# Patient Record
Sex: Male | Born: 1955 | Race: White | Hispanic: No | Marital: Married | Smoking: Never smoker
Health system: Southern US, Community
[De-identification: ages and names within clinical notes are randomized; demographics above are authoritative.]

## PROBLEM LIST (undated history)

## (undated) DIAGNOSIS — E782 Mixed hyperlipidemia: Secondary | ICD-10-CM

## (undated) DIAGNOSIS — F1020 Alcohol dependence, uncomplicated: Secondary | ICD-10-CM

## (undated) DIAGNOSIS — K219 Gastro-esophageal reflux disease without esophagitis: Secondary | ICD-10-CM

## (undated) DIAGNOSIS — E119 Type 2 diabetes mellitus without complications: Secondary | ICD-10-CM

## (undated) DIAGNOSIS — R634 Abnormal weight loss: Secondary | ICD-10-CM

## (undated) DIAGNOSIS — K7460 Unspecified cirrhosis of liver: Secondary | ICD-10-CM

## (undated) DIAGNOSIS — K2270 Barrett's esophagus without dysplasia: Secondary | ICD-10-CM

## (undated) DIAGNOSIS — I700 Atherosclerosis of aorta: Secondary | ICD-10-CM

## (undated) DIAGNOSIS — I350 Nonrheumatic aortic (valve) stenosis: Secondary | ICD-10-CM

## (undated) DIAGNOSIS — I10 Essential (primary) hypertension: Secondary | ICD-10-CM

## (undated) HISTORY — PX: TONSILLECTOMY: SUR1361

## (undated) HISTORY — PX: IMPLANTATION, AORTIC VALVE, TRANSCATHETER, SUBCLAVIAN ARTERY APPROACH: SHX7173

## (undated) HISTORY — PX: COLONOSCOPY: SHX174

---

## 2011-04-03 ENCOUNTER — Ambulatory Visit: Payer: Self-pay | Admitting: Family Medicine

## 2011-04-10 ENCOUNTER — Ambulatory Visit: Payer: Self-pay | Admitting: Urology

## 2011-04-17 ENCOUNTER — Ambulatory Visit: Payer: Self-pay | Admitting: Urology

## 2011-04-23 ENCOUNTER — Ambulatory Visit: Payer: Self-pay | Admitting: Urology

## 2011-04-26 ENCOUNTER — Ambulatory Visit: Payer: Self-pay | Admitting: Urology

## 2011-05-03 ENCOUNTER — Ambulatory Visit: Payer: Self-pay | Admitting: Urology

## 2011-05-24 ENCOUNTER — Ambulatory Visit: Payer: Self-pay | Admitting: Urology

## 2011-06-05 ENCOUNTER — Ambulatory Visit: Payer: Self-pay | Admitting: Urology

## 2011-06-19 ENCOUNTER — Ambulatory Visit: Payer: Self-pay | Admitting: Urology

## 2011-06-28 ENCOUNTER — Ambulatory Visit: Payer: Self-pay | Admitting: Urology

## 2011-08-23 ENCOUNTER — Ambulatory Visit: Payer: Self-pay | Admitting: Urology

## 2012-09-24 IMAGING — CR DG ABDOMEN 1V
1 series · 1 of 1 positions shown · non-contrast
Comparison: none

REASON FOR EXAM: kidney stone
COMMENTS:

[supine kub]
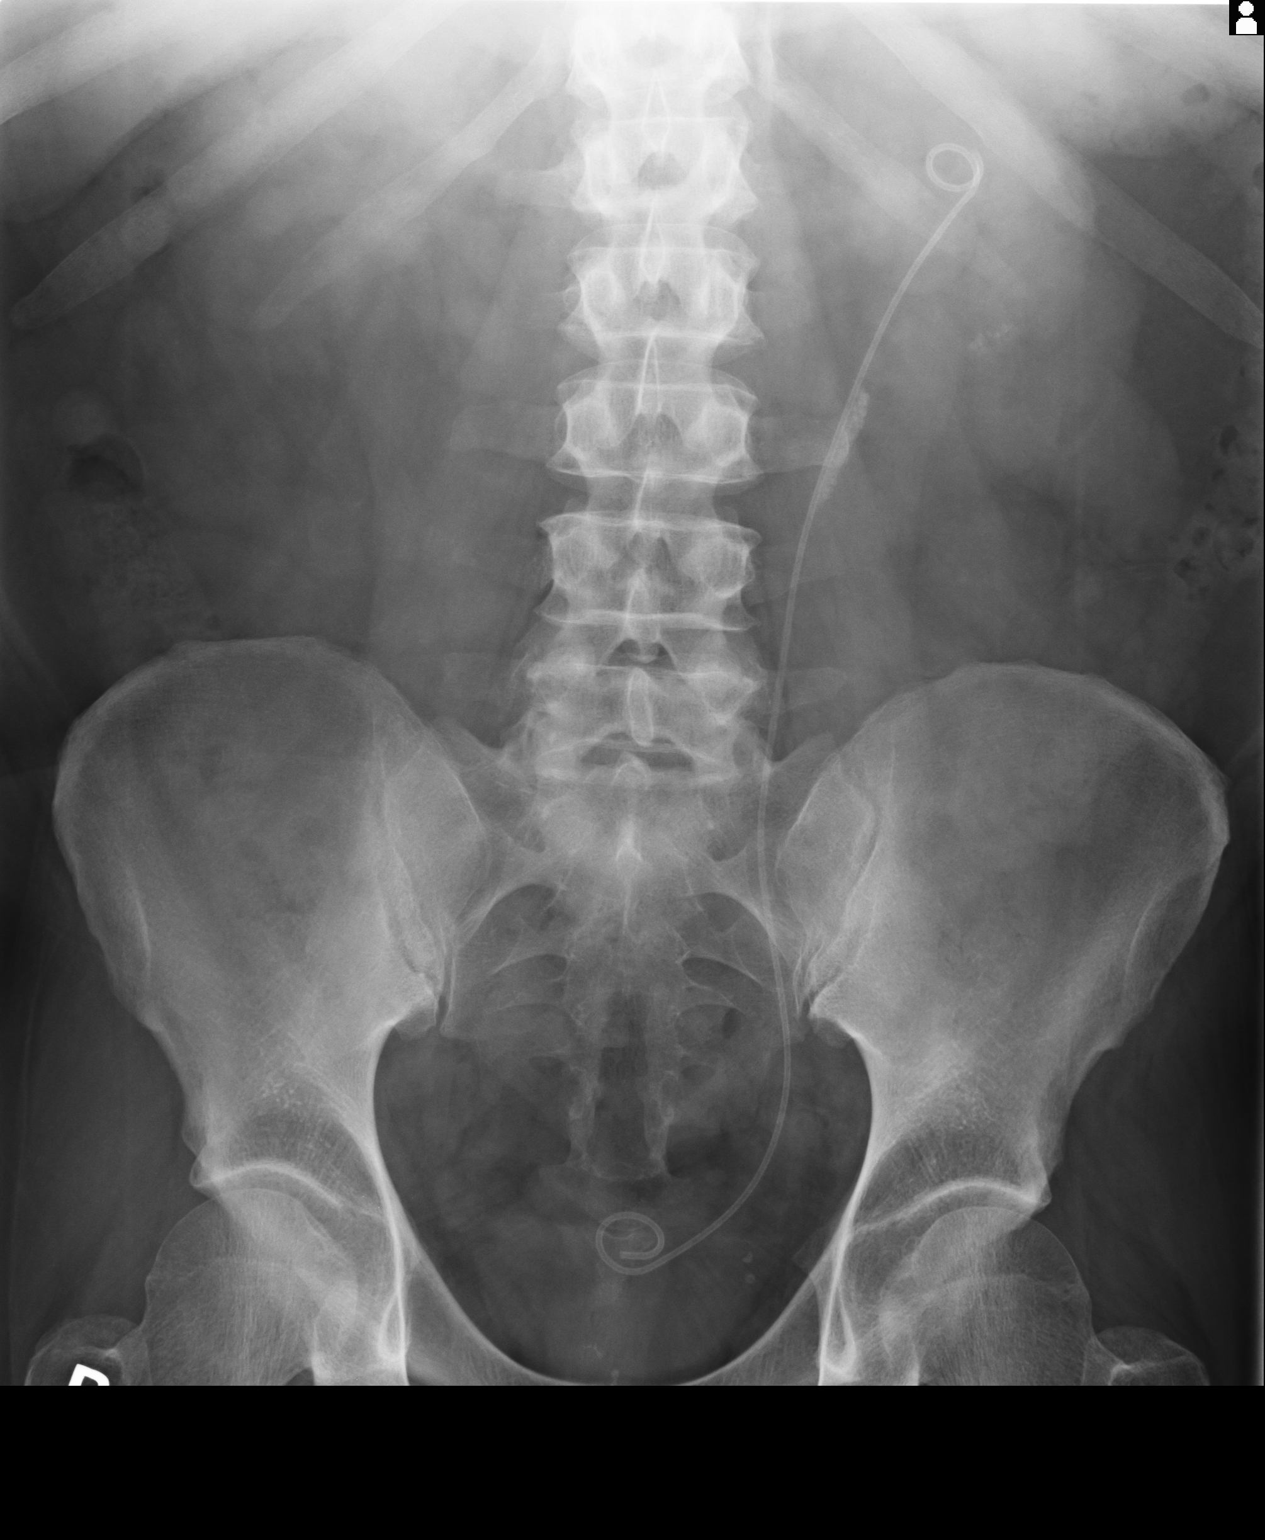

[1 of 1 positions shown; findings below may reference images not displayed]

PROCEDURE:     DXR - DXR KIDNEY URETER BLADDER  - May 03, 2011 [DATE]

RESULT:     Comparison is made to the prior exam of 04/26/2011. There are
noted a few calcifications projected over the lower pole of the left kidney
compatible with a complex of small stone fragments. There are multiple stone
fragments visualized adjacent to the left ureteral stent. The stone
fragments are at the level of the L3 lumbar transverse process. No definite
right renal or right ureteral stones are seen.
IMPRESSION: 1. A left ureteral stent is present.
2. There is a complex of small calcification fragments projected over the
lower pole of the left kidney.
3. There is a linear band of stone fragments in the proximal left ureter
adjacent to the stent at the level of the L3 lumbar spine transverse process.

## 2012-10-15 IMAGING — CR DG ABDOMEN 1V
1 series · 2 of 2 positions shown · non-contrast
Comparison: none

REASON FOR EXAM: KIDNEY STONE
COMMENTS:

[Series 4: t abdomen supine · 0.14mm/px · 2 of 2 slices shown]
[im 1/2]
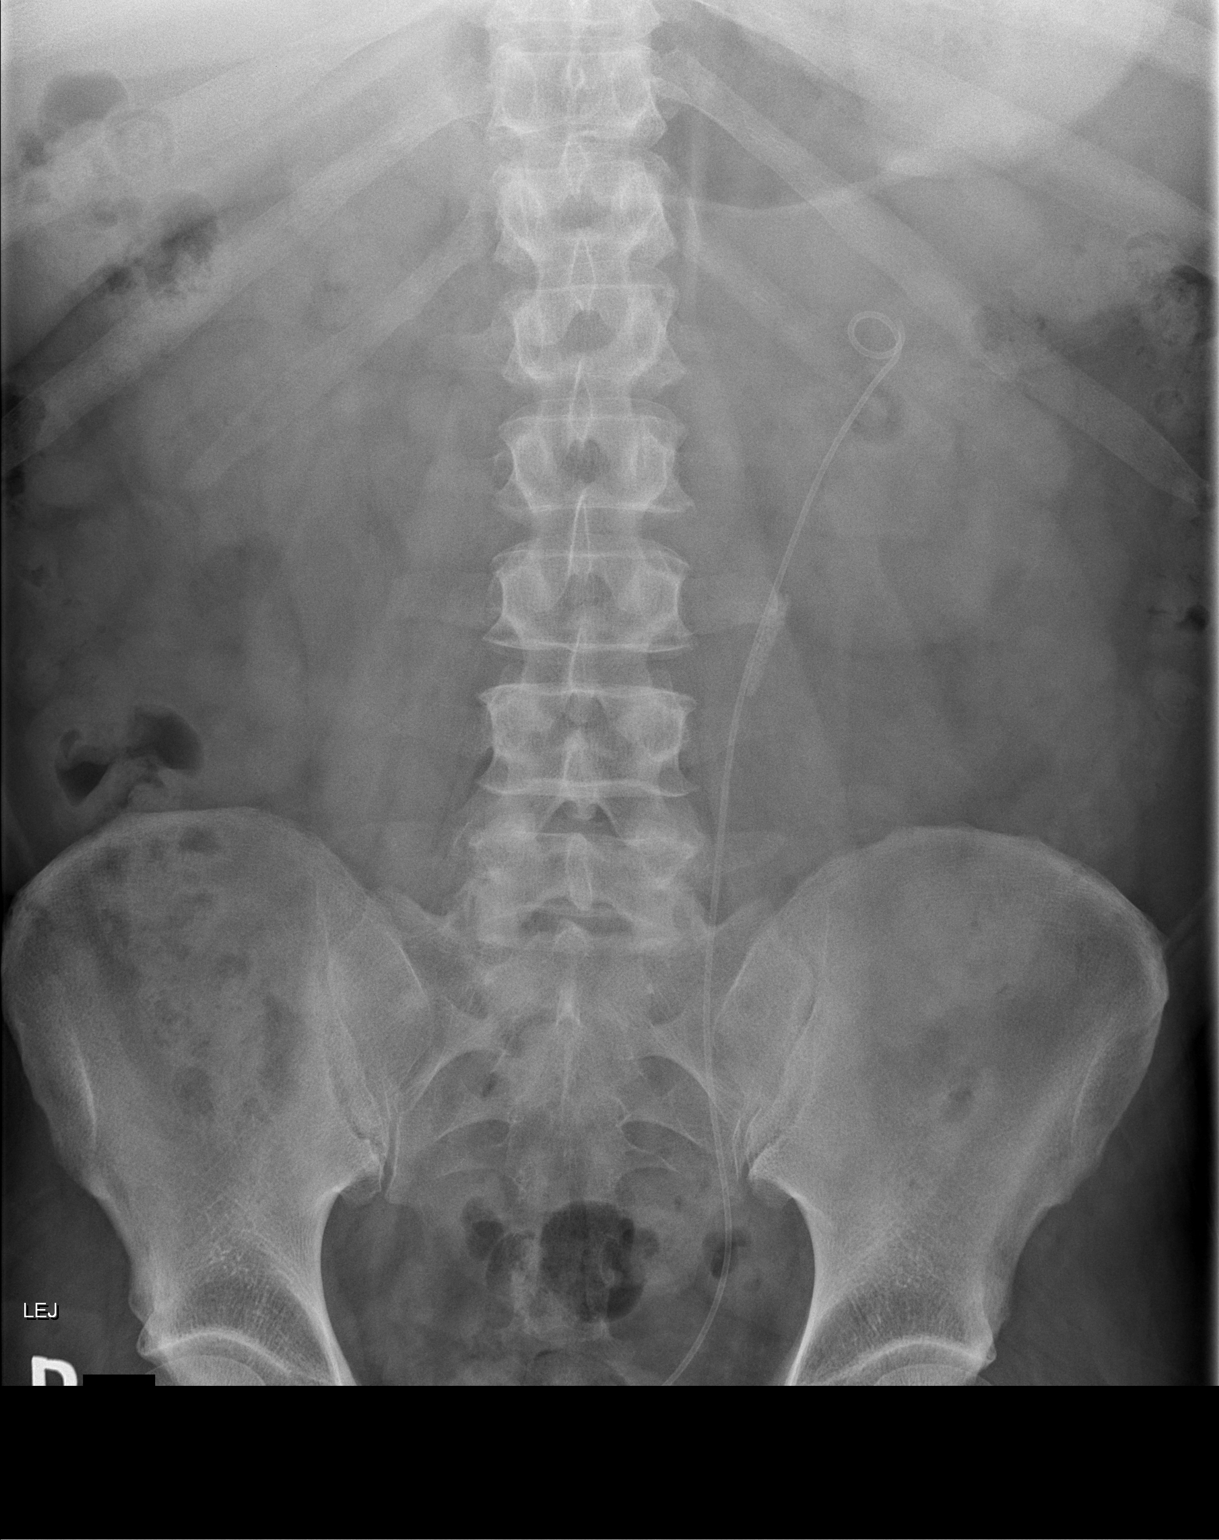
[im 2/2]
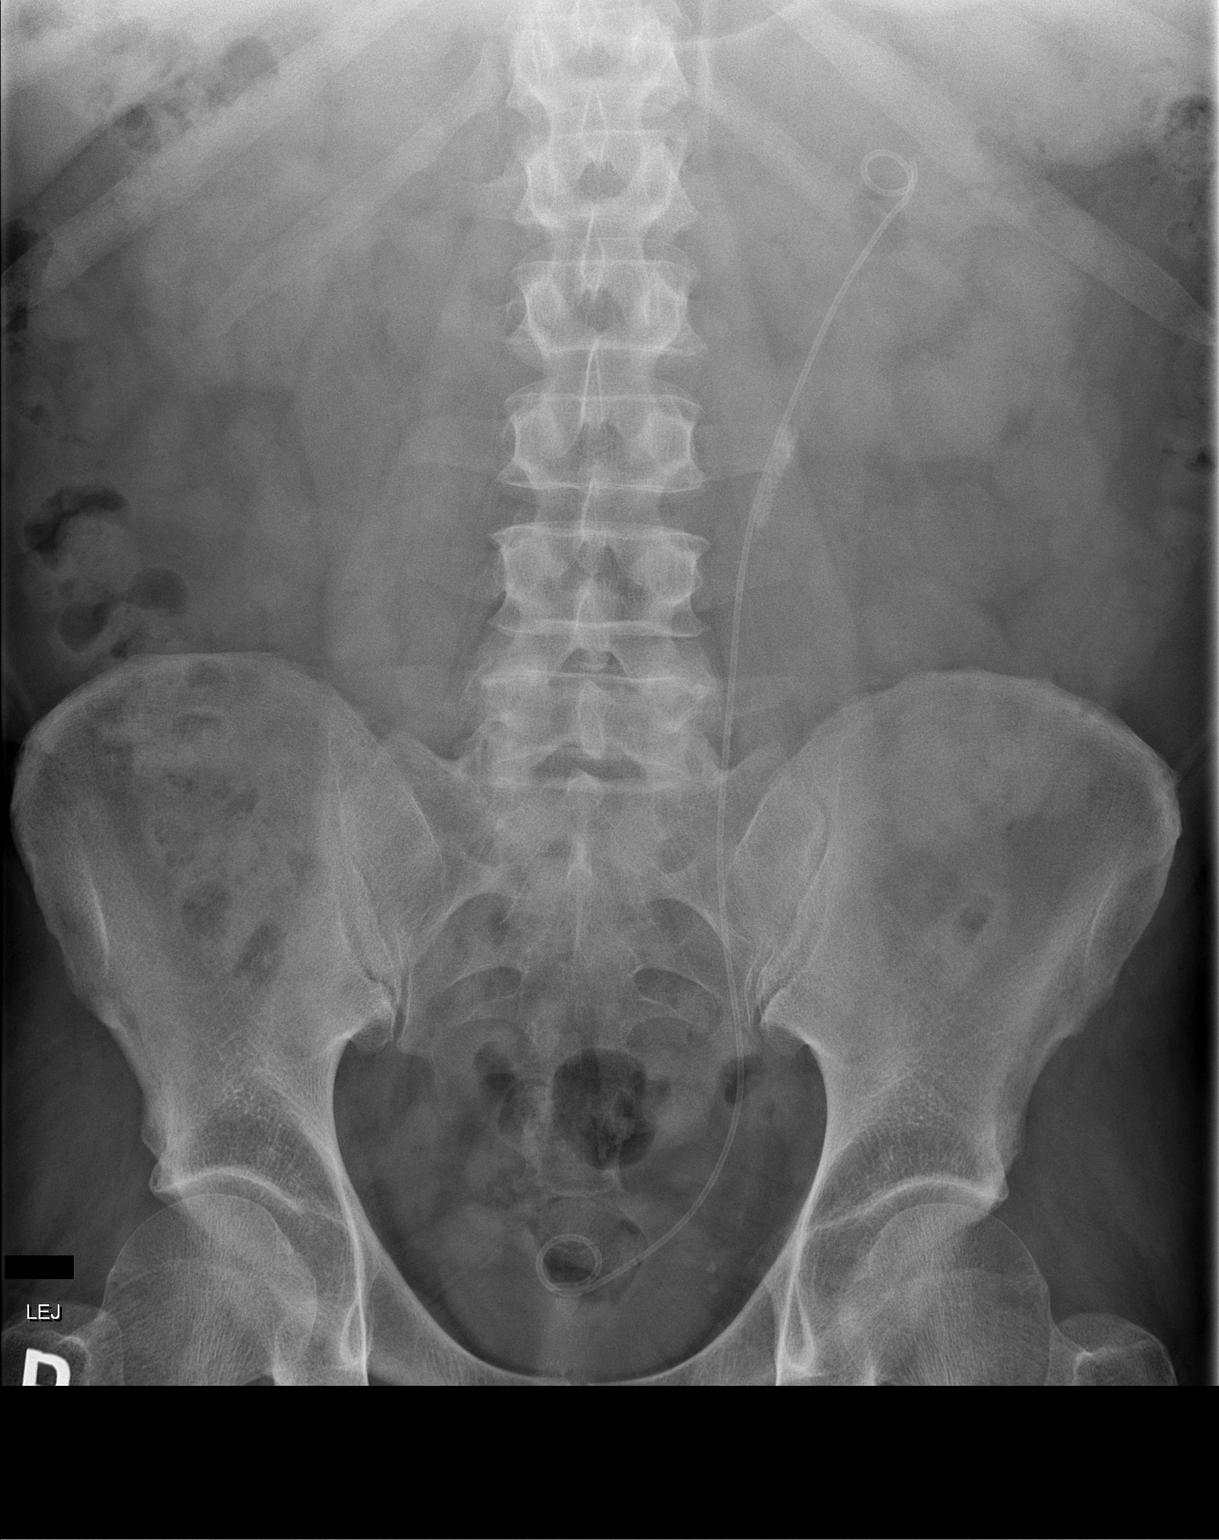

[2 of 2 positions shown; findings below may reference images not displayed]

PROCEDURE:     DXR - DXR KIDNEY URETER BLADDER  - May 24, 2011  [DATE]

RESULT:     Comparison is made to the study of to May 2011.

A double-J ureteral stent on the left is unchanged. There is calcific
density in a linear fashion suggesting [place] in the proximal one
third of the left ureter. Appears unchanged. I do not see definite stones
elsewhere over both kidneys or along the expected course of the ureters.
IMPRESSION: There is a stable appearing calcification in the proximal
one third of the stented left ureter. The previously demonstrated stones
over the lower pole of the left kidney are not visible today.

[REDACTED]

## 2012-11-19 IMAGING — CR DG ABDOMEN 1V
1 series · 2 of 2 positions shown · non-contrast
Comparison: none

REASON FOR EXAM: flank pain kidney stone follow up
COMMENTS:

PROCEDURE:     DXR - DXR KIDNEY URETER BLADDER  - June 28, 2011  [DATE]
RESULT:     Comparison: 06/19/2011

[Series 8: t abdomen supine · 0.14mm/px · 2 of 2 slices shown]
[im 1/2]
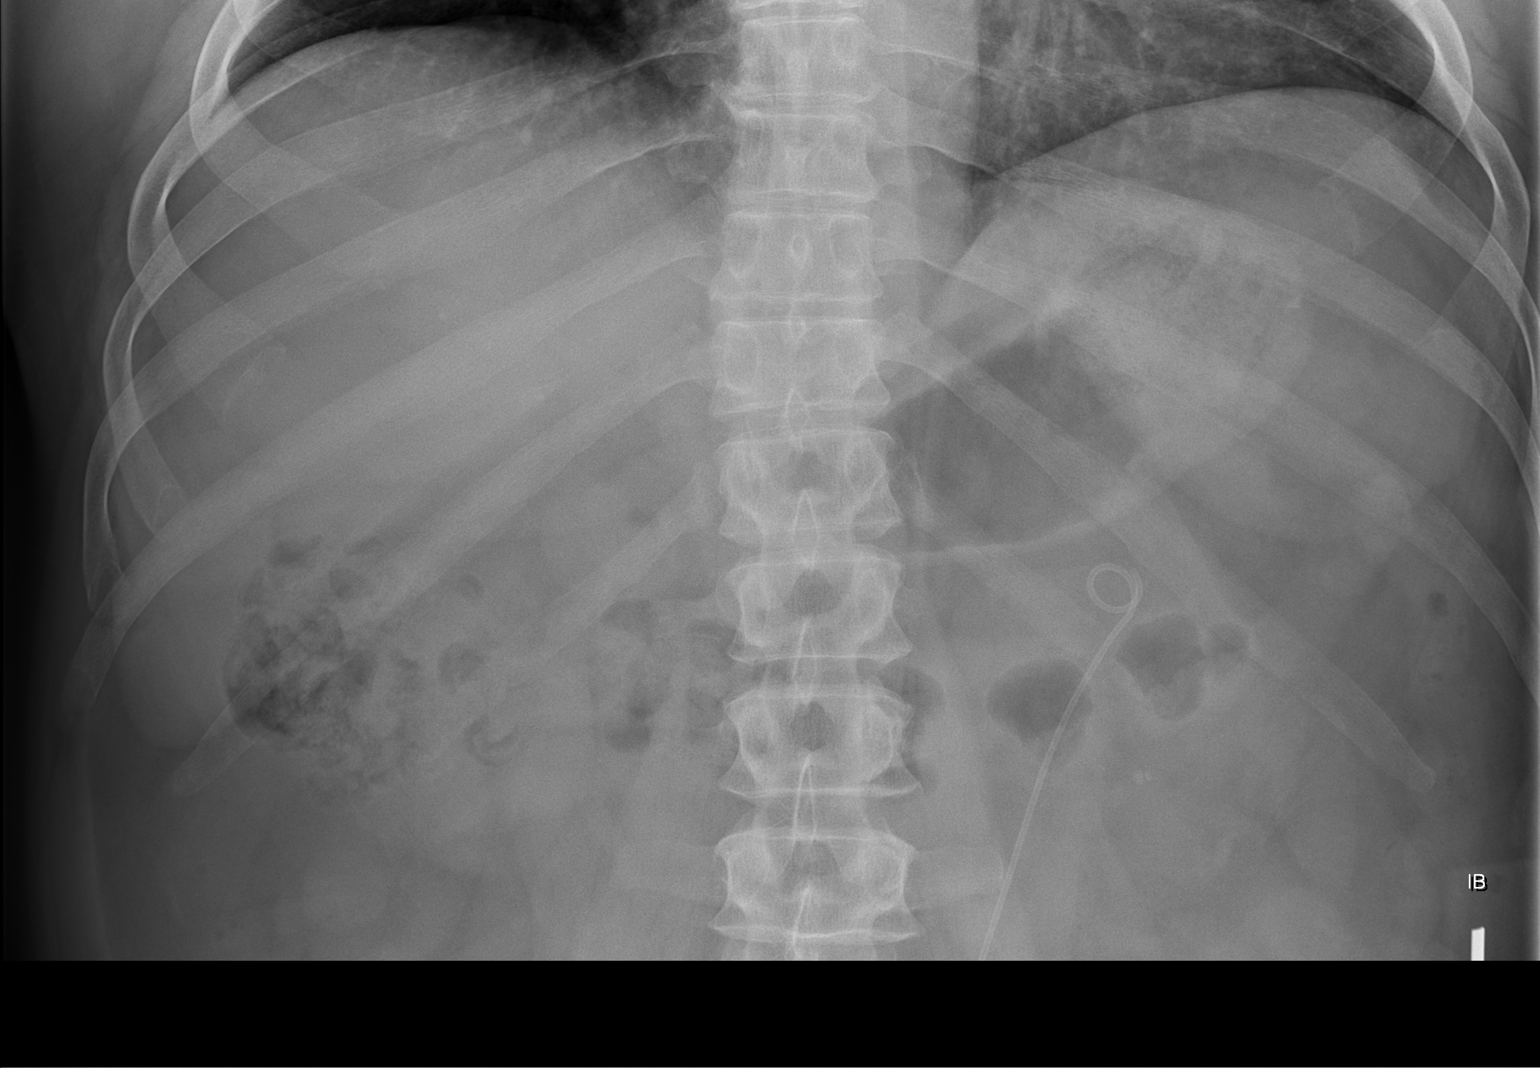
[im 2/2]
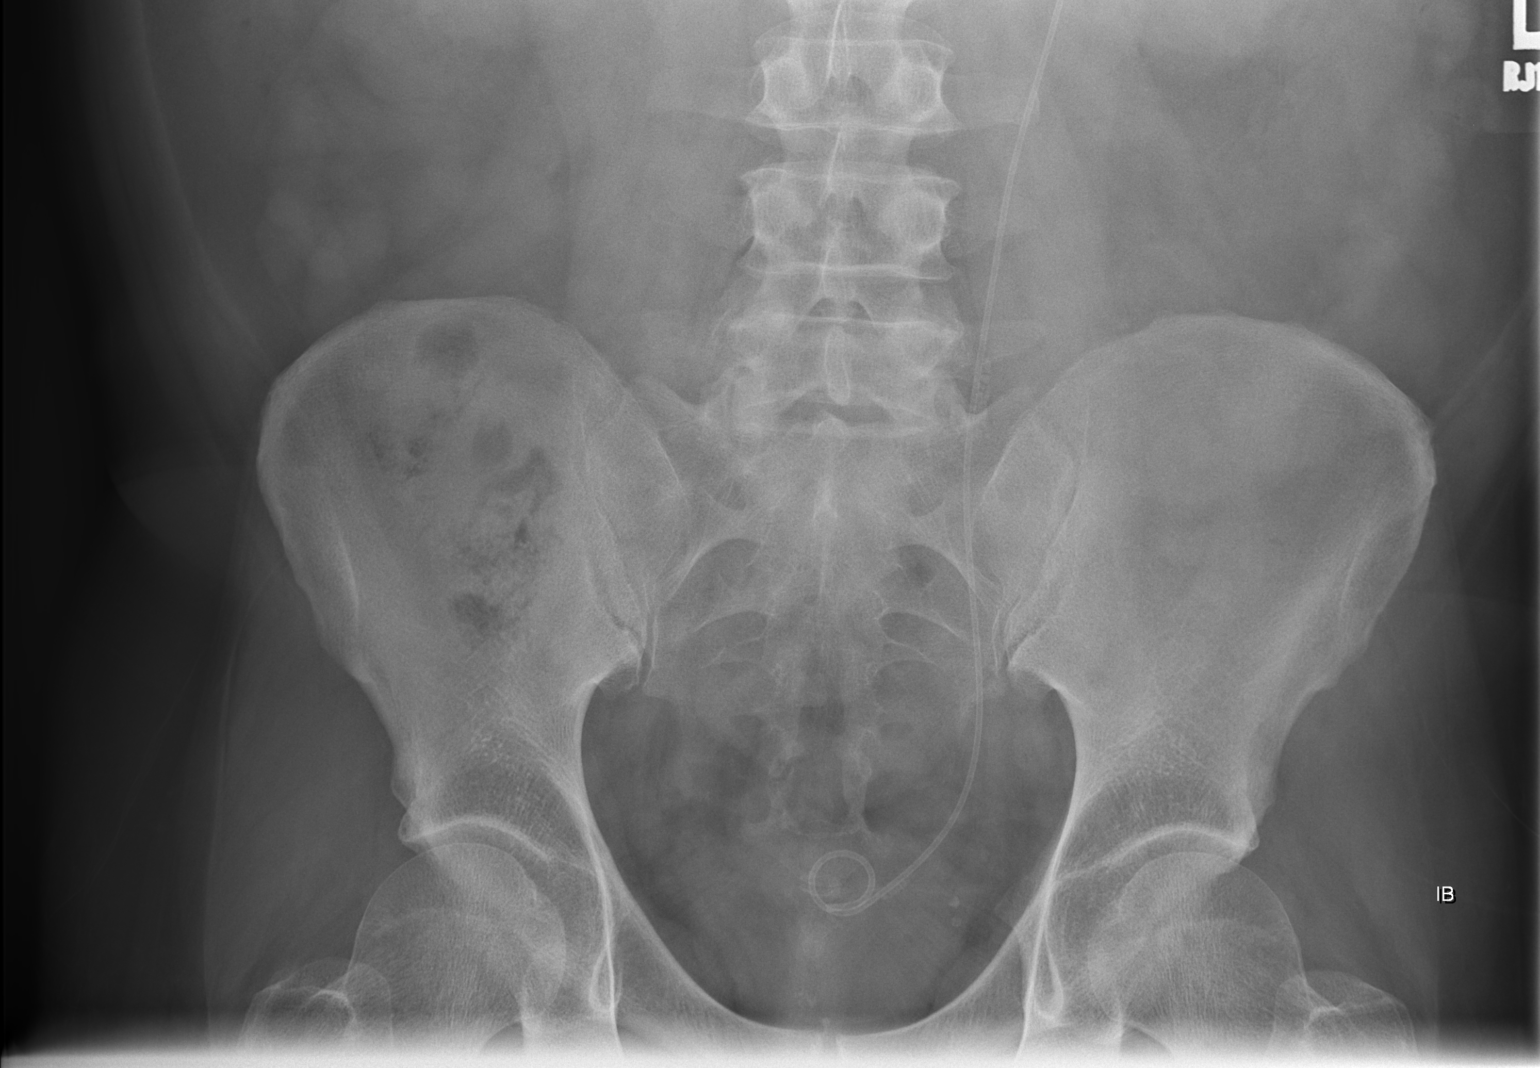

[2 of 2 positions shown; findings below may reference images not displayed]

FINDINGS: A left-sided double-J renal stent is in place. There are multiple small
calcific densities overlying the stent at the level of L5-S1, which is more
inferior than prior. Calcifications overlying left kidney are similar to
prior. Calcifications in pelvis are likely secondary to phleboliths.
IMPRESSION: Slight interval advancement of the calcifications in the left ureter, now
overlying the level of L5-S1.

## 2014-04-25 NOTE — Op Note (Signed)
PATIENT NAME:  Christopher Huff, Christopher Huff MR#:  [id number] DATE OF BIRTH:  10-Jan-1955  DATE OF PROCEDURE:  04/23/2011  PREOPERATIVE DIAGNOSIS: Left renal calculus.   POSTOPERATIVE DIAGNOSIS: Left renal calculus.  PROCEDURES: Cystoscopy, placement of double-J stent.  SURGEON: Alcus Dad, MD  ANESTHESIA: General.   INDICATIONS: This 59 year old man noted flank pain on the left. He was noted to have a stone in the left renal pelvis on x-ray evaluation. The stone is 1 cm in overall diameter. He is scheduled for placement of a stent prior to lithotripsy.   DESCRIPTION OF PROCEDURE: In the dorsal lithotomy position under general anesthesia the lower abdomen and genital area was prepped and draped for endoscopic work. The #21 cystoscope was introduced and inspection of the bladder performed revealing benign prostatic hypertrophy but no other abnormality. A hydrophil Glidewire was introduced into the left renal pelvis and confirmed by fluoro. A 28 cm, 4.8 French double-J stent was introduced. The retrieval string was left in place, but was shortened. Fluid was drained from the bladder, and the procedure was concluded. A final fluoroscopic view revealed that the stent was in good position, in the left renal pelvis. The patient tolerated the procedure well and returned to the recovery area in satisfactory condition. ____________________________ Alcus Dad, MD jsh:slb D: 04/23/2011 13:36:25 ET    T: 04/23/2011 13:46:27 ET        JOB#: [id number] cc: Alcus Dad, MD, <Dictator> Alcus Dad MD ELECTRONICALLY SIGNED 04/24/2011 12:59

## 2015-02-09 ENCOUNTER — Telehealth: Payer: Self-pay | Admitting: Family Medicine

## 2015-02-09 NOTE — Telephone Encounter (Signed)
Pt would like to re-establish with Dr. Caryn Section. Pt was last seen by Dr. Caryn Section on 03/29/11. Pt would like to come in to re-establish care and be seen for cough, congestion, & ear pain. Pt would like to be worked in tomorrow if possible. No medications. No new medical conditions. Insurance: [employer]. Pt stated he hasn't had another PCP he just hasn't been sick in the last 3 years. Can we re-establish? If so, when can we work him in? Please advise. Thanks TNP

## 2015-02-09 NOTE — Telephone Encounter (Signed)
He can have the 8:30am. If he can't make that [facility] has plenty of slots available.

## 2015-02-09 NOTE — Telephone Encounter (Signed)
Please review-aa 

## 2015-02-10 NOTE — Telephone Encounter (Signed)
Called pt to schedule appointment-Left message/MW

## 2020-08-16 ENCOUNTER — Other Ambulatory Visit: Payer: Self-pay

## 2020-08-16 ENCOUNTER — Encounter: Payer: Self-pay | Admitting: Emergency Medicine

## 2020-08-16 ENCOUNTER — Emergency Department: Payer: Medicare HMO

## 2020-08-16 ENCOUNTER — Emergency Department
Admission: EM | Admit: 2020-08-16 | Discharge: 2020-08-16 | Disposition: A | Discharge status: Home / Self Care | Payer: Medicare HMO | Attending: Emergency Medicine | Admitting: Emergency Medicine

## 2020-08-16 DIAGNOSIS — R1900 Intra-abdominal and pelvic swelling, mass and lump, unspecified site: Secondary | ICD-10-CM | POA: Diagnosis not present

## 2020-08-16 DIAGNOSIS — F17200 Nicotine dependence, unspecified, uncomplicated: Secondary | ICD-10-CM | POA: Insufficient documentation

## 2020-08-16 DIAGNOSIS — E119 Type 2 diabetes mellitus without complications: Secondary | ICD-10-CM | POA: Insufficient documentation

## 2020-08-16 DIAGNOSIS — R739 Hyperglycemia, unspecified: Secondary | ICD-10-CM | POA: Diagnosis present

## 2020-08-16 LAB — COMPREHENSIVE METABOLIC PANEL
ALT: 21 U/L (ref 0–44)
AST: 33 U/L (ref 15–41)
Albumin: 3.9 g/dL (ref 3.5–5.0)
Alkaline Phosphatase: 166 U/L — ABNORMAL HIGH (ref 38–126)
Anion gap: 13 (ref 5–15)
BUN: 10 mg/dL (ref 8–23)
CO2: 24 mmol/L (ref 22–32)
Calcium: 9.1 mg/dL (ref 8.9–10.3)
Chloride: 94 mmol/L — ABNORMAL LOW (ref 98–111)
Creatinine, Ser: 0.67 mg/dL (ref 0.61–1.24)
GFR, Estimated: >60 mL/min (ref >60)
Glucose, Bld: 495 mg/dL — ABNORMAL HIGH (ref 70–99)
Potassium: 4 mmol/L (ref 3.5–5.1)
Sodium: 131 mmol/L — ABNORMAL LOW (ref 135–145)
Total Bilirubin: 1.5 mg/dL — ABNORMAL HIGH (ref 0.3–1.2)
Total Protein: 7.4 g/dL (ref 6.5–8.1)

## 2020-08-16 LAB — CBC WITH DIFFERENTIAL/PLATELET
Abs Immature Granulocytes: 0.01 10*3/uL (ref 0.00–0.07)
Basophils Absolute: 0.1 10*3/uL (ref 0.0–0.1)
Basophils Relative: 1 %
Eosinophils Absolute: 0.1 10*3/uL (ref 0.0–0.5)
Eosinophils Relative: 2 %
HCT: 40.5 % (ref 39.0–52.0)
Hemoglobin: 14.4 g/dL (ref 13.0–17.0)
Immature Granulocytes: 0 %
Lymphocytes Relative: 19 %
Lymphs Abs: 1 10*3/uL (ref 0.7–4.0)
MCH: 36.1 pg — ABNORMAL HIGH (ref 26.0–34.0)
MCHC: 35.6 g/dL (ref 30.0–36.0)
MCV: 101.5 fL — ABNORMAL HIGH (ref 80.0–100.0)
Monocytes Absolute: 0.6 10*3/uL (ref 0.1–1.0)
Monocytes Relative: 11 %
Neutro Abs: 3.3 10*3/uL (ref 1.7–7.7)
Neutrophils Relative %: 67 %
Platelets: 147 10*3/uL — ABNORMAL LOW (ref 150–400)
RBC: 3.99 MIL/uL — ABNORMAL LOW (ref 4.22–5.81)
RDW: 12.6 % (ref 11.5–15.5)
WBC: 5.1 10*3/uL (ref 4.0–10.5)
nRBC: 0 % (ref 0.0–0.2)

## 2020-08-16 LAB — CBG MONITORING, ED: Glucose-Capillary: 490 mg/dL — ABNORMAL HIGH (ref 70–99)

## 2020-08-16 MED ORDER — INSULIN ASPART 100 UNIT/ML IJ SOLN
8.0000 [IU] | Freq: Once | INTRAMUSCULAR | Status: AC
  Administered 2020-08-16: 8 [IU] via INTRAVENOUS
  Filled 2020-08-16: qty 1

## 2020-08-16 MED ORDER — IOHEXOL 350 MG/ML SOLN
100.0000 mL | Freq: Once | INTRAVENOUS | Status: AC | PRN
  Administered 2020-08-16: 100 mL via INTRAVENOUS
  Filled 2020-08-16: qty 100

## 2020-08-16 MED ORDER — METFORMIN HCL 500 MG PO TABS: 500.0000 mg | ORAL_TABLET | Freq: Two times a day (BID) | ORAL | 11 refills | Status: AC

## 2020-08-16 MED ORDER — SODIUM CHLORIDE 0.9 % IV SOLN: Freq: Once | INTRAVENOUS | Status: AC

## 2020-08-16 NOTE — ED Provider Notes (Signed)
[facility] Emergency Department Provider Note   ____________________________________________    I have reviewed the triage vital signs and the nursing notes.   HISTORY  Chief Complaint Elevated glucose     HPI Christopher Huff is a 65 y.o. male who reports no significant past medical history presents with complaints of elevated glucose.  Patient reports significant weight loss, frequent urination and increased thirst over the last several weeks, his friend who is a diabetic was concerned that his glucose was would be high and checked his sugar which was elevated.  Patient reports he feels well overall and has no complaints.  No nausea or vomiting.  No fevers or chills.  Does report increased weight loss over the last 6 months which has been significant  History reviewed. No pertinent past medical history.  There are no problems to display for this patient.   History reviewed. No pertinent surgical history.  Prior to Admission medications   Medication Sig Start Date End Date Taking? Authorizing Provider  metFORMIN (GLUCOPHAGE) 500 MG tablet Take 1 tablet (500 mg total) by mouth 2 (two) times daily with a meal. 08/16/20 08/16/21 Yes Lavonia Drafts, MD     Allergies Patient has no known allergies.  History reviewed. No pertinent family history.  Social History Social History   Tobacco Use   Smoking status: Never   Smokeless tobacco: Current    Types: Snuff  Substance Use Topics   Alcohol use: Yes    Alcohol/week: 14.0 standard drinks    Types: 14 Cans of beer per week   Drug use: Never    Review of Systems  Constitutional: As above Eyes: No visual changes.  ENT: No sore throat. Cardiovascular: Denies chest pain. Respiratory: Denies shortness of breath. Gastrointestinal: As above Genitourinary: Negative for dysuria. Musculoskeletal: Negative for back pain. Skin: Negative for rash. Neurological: Negative for headaches     ____________________________________________   PHYSICAL EXAM:  VITAL SIGNS: ED Triage Vitals  Enc Vitals Group     BP 08/16/20 1524 (!) 169/67     Pulse Rate 08/16/20 1524 78     Resp 08/16/20 1524 17     Temp 08/16/20 1524 98.8 F (37.1 C)     Temp Source 08/16/20 1524 Oral     SpO2 08/16/20 1524 98 %     Weight 08/16/20 1506 61.2 kg (135 lb)     Height 08/16/20 1506 1.702 m ('5\' 7"'$ )     Head Circumference --      Peak Flow --      Pain Score 08/16/20 1506 0     Pain Loc --      Pain Edu? --      Excl. in [place]? --     Constitutional: Alert and oriented.  Eyes: Conjunctivae are normal.  Nose: No congestion/rhinnorhea. Mouth/Throat: Mucous membranes are moist.    Cardiovascular: Normal rate, regular rhythm. Kermit Balo peripheral circulation. Respiratory: Normal respiratory effort.  No retractions.  Gastrointestinal: Soft and nontender. No distention.   Genitourinary: deferred Musculoskeletal: No lower extremity tenderness nor edema.  Warm and well perfused Neurologic:  Normal speech and language. No gross focal neurologic deficits are appreciated.  Skin:  Skin is warm, dry and intact. No rash noted. Psychiatric: Mood and affect are normal. Speech and behavior are normal.  ____________________________________________   LABS (all labs ordered are listed, but only abnormal results are displayed)  Labs Reviewed  CBC WITH DIFFERENTIAL/PLATELET - Abnormal; Notable for the following components:  Result Value   RBC 3.99 (*)    MCV 101.5 (*)    MCH 36.1 (*)    Platelets 147 (*)    All other components within normal limits  COMPREHENSIVE METABOLIC PANEL - Abnormal; Notable for the following components:   Sodium 131 (*)    Chloride 94 (*)    Glucose, Bld 495 (*)    Alkaline Phosphatase 166 (*)    Total Bilirubin 1.5 (*)    All other components within normal limits  CBG MONITORING, ED - Abnormal; Notable for the following components:   Glucose-Capillary 490 (*)     All other components within normal limits   ____________________________________________  EKG  None ____________________________________________  RADIOLOGY  CT abdomen pelvis reviewed by me, no evidence of pancreatic mass ____________________________________________   PROCEDURES  Procedure(s) performed: No  Procedures   Critical Care performed: No ____________________________________________   INITIAL IMPRESSION / ASSESSMENT AND PLAN / ED COURSE  Pertinent labs & imaging results that were available during my care of the patient were reviewed by me and considered in my medical decision making (see chart for details).   Patient presents with elevated glucose as detailed above, consistent with type 2 diabetes, new onset.  Overall he is well-appearing in no acute distress, not consistent with DKA.  Lab work is overall reassuring, normal anion gap.  Will treat with IV fluids, IV insulin  Given significant weight loss described and relatively old age for development of type 2 diabetes sent for CT scan of the abdomen to evaluate for pancreatic or abdominal mass.  No evidence of CA however does have changes of early cirrhosis, will refer to GI, patient has IM follow-up.  Kidney and liver function appear normal via labs    ____________________________________________   FINAL CLINICAL IMPRESSION(S) / ED DIAGNOSES  Final diagnoses:  Type 2 diabetes mellitus without complication, without long-term current use of insulin ([place])        Note:  This document was prepared using Dragon voice recognition software and may include unintentional dictation errors.    Lavonia Drafts, MD 08/16/20 Tresa Moore

## 2020-08-16 NOTE — ED Triage Notes (Signed)
Pt reports has lost 40lbs in a year and his friend was worried about him so he checked his Pt reports had a friend check his blood sugar today and it was above 500 so he brought him to the ED. Pt reports was checked at 14:00-14:30 and it was high. Pt reportsd last ate at 56 and ate a cheeseburger, fries and sweet tea. Pt reports is thirsty a lot and does pee a lot and his friend said he may be a diabetic

## 2020-11-09 ENCOUNTER — Other Ambulatory Visit: Payer: Self-pay | Admitting: Gastroenterology

## 2020-11-09 DIAGNOSIS — K7460 Unspecified cirrhosis of liver: Secondary | ICD-10-CM

## 2020-11-09 DIAGNOSIS — R634 Abnormal weight loss: Secondary | ICD-10-CM

## 2020-11-10 ENCOUNTER — Encounter: Payer: Self-pay | Admitting: Gastroenterology

## 2020-11-10 NOTE — H&P (Signed)
Pre-Procedure H&P   Patient ID: Christopher Huff is a 65 y.o. male.  Gastroenterology Provider: Annamaria Helling, DO  PCP: Dr. Ola Spurr  Date: 11/11/2020  HPI [name] is a 65 y.o. male who presents today for Esophagogastroduodenoscopy and Colonoscopy for esophageal varices screening and screening colonoscopy.  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 CT performed with concern for esophageal varices. Pt with significant weight loss of 50 lbs in less than a year. No other UGI or LGI sx. BM have been regular and w/o blood. New onset diabetes. Normal colonoscopy per patient 10+ years ago. Has never undergone egd prior. No fhx crc or polyps.  No other acute gi complaints.  Past Medical History:  Diagnosis Date   Alcohol dependence ([place])    Diabetes mellitus without complication ([place])    Hypertension    Weight loss     Past Surgical History:  Procedure Laterality Date   COLONOSCOPY     TONSILLECTOMY      Family History No h/o GI disease or malignancy  Review of Systems  Constitutional:  Positive for unexpected weight change (50 lb weight loss). Negative for activity change, appetite change, chills, fatigue and fever.  HENT:  Negative for trouble swallowing and voice change.   Respiratory:  Negative for shortness of breath.   Cardiovascular:  Negative for chest pain and palpitations.  Gastrointestinal:  Negative for abdominal distention, abdominal pain, anal bleeding, blood in stool, constipation, diarrhea, nausea and vomiting.  Musculoskeletal:  Negative for arthralgias and myalgias.  Skin:  Negative for color change and pallor.  Neurological:  Negative for dizziness, syncope and weakness.  Psychiatric/Behavioral:  Negative for confusion. The patient is not nervous/anxious.   All other systems reviewed and are negative.   Medications No current facility-administered medications on file prior to encounter.   Current Outpatient Medications on  File Prior to Encounter  Medication Sig Dispense Refill   metFORMIN (GLUCOPHAGE) 500 MG tablet Take 1 tablet (500 mg total) by mouth 2 (two) times daily with a meal. 60 tablet 11   omeprazole (PRILOSEC) 20 MG capsule Take 20 mg by mouth daily.      Pertinent medications related to GI and procedure were reviewed by me with the patient prior to the procedure   Current Facility-Administered Medications:    0.9 %  sodium chloride infusion, , Intravenous, Continuous, Annamaria Helling, DO, Last Rate: 20 mL/hr at 11/11/20 0835, New Bag at 11/11/20 0835      No Known Allergies Allergies were reviewed by me prior to the procedure  Objective    Vitals:   11/11/20 0822  BP: 125/63  Pulse: (!) 57  Resp: 18  Temp: (!) 96.1 F (35.6 C)  TempSrc: Temporal  SpO2: 100%  Weight: 54.9 kg  Height: 5\' 7"  (1.702 m)     Physical Exam Vitals and nursing note reviewed.  Constitutional:      General: He is not in acute distress.    Appearance: Normal appearance. He is ill-appearing. He is not toxic-appearing or diaphoretic.     Comments: thin  HENT:     Head: Normocephalic and atraumatic.     Comments: Bitemporal wasting    Nose: Nose normal.     Mouth/Throat:     Mouth: Mucous membranes are moist.     Pharynx: Oropharynx is clear.  Eyes:     General: No scleral icterus.    Extraocular Movements: Extraocular movements intact.  Cardiovascular:  Rate and Rhythm: Normal rate and regular rhythm.     Heart sounds: Murmur heard.    No friction rub. No gallop.  Pulmonary:     Effort: Pulmonary effort is normal. No respiratory distress.     Breath sounds: Normal breath sounds. No wheezing, rhonchi or rales.  Abdominal:     General: Bowel sounds are normal. There is no distension.     Palpations: Abdomen is soft.     Tenderness: There is no abdominal tenderness. There is no guarding or rebound.  Musculoskeletal:     Cervical back: Neck supple.     Right lower leg: No edema.      Left lower leg: No edema.     Comments: Muscle atrophy  Skin:    General: Skin is warm and dry.     Coloration: Skin is not jaundiced or pale.  Neurological:     General: No focal deficit present.     Mental Status: He is alert and oriented to person, place, and time. Mental status is at baseline.  Psychiatric:        Mood and Affect: Mood normal.        Behavior: Behavior normal.        Thought Content: Thought content normal.        Judgment: Judgment normal.     Assessment:  [name] is a 65 y.o. male  who presents today for Esophagogastroduodenoscopy and Colonoscopy for esophageal varices screening and screening colonoscopy.  Plan:  Esophagogastroduodenoscopy and Colonoscopy with possible intervention today  Esophagogastroduodenoscopy and colonoscopy with possible biopsy, control of bleeding, polypectomy, and interventions as necessary has been discussed with the patient/patient representative. Informed consent was obtained from the patient/patient representative after explaining the indication, nature, and risks of the procedure including but not limited to death, bleeding, perforation, missed neoplasm/lesions, cardiorespiratory compromise, and reaction to medications. Opportunity for questions was given and appropriate answers were provided. Patient/patient representative has verbalized understanding is amenable to undergoing the procedure.   Annamaria Helling, DO  [facility]  Portions of the record may have been created with voice recognition software. Occasional wrong-word or 'sound-a-like' substitutions may have occurred due to the inherent limitations of voice recognition software.  Read the chart carefully and recognize, using context, where substitutions may have occurred.

## 2020-11-11 ENCOUNTER — Encounter
Admission: RE | Disposition: A | Discharge status: Home / Self Care | Payer: Self-pay | Source: Home / Self Care | Attending: Gastroenterology

## 2020-11-11 ENCOUNTER — Ambulatory Visit
Admission: RE | Admit: 2020-11-11 | Discharge: 2020-11-11 | Disposition: A | Discharge status: Home / Self Care | Payer: Medicare HMO | Attending: Gastroenterology | Admitting: Gastroenterology

## 2020-11-11 ENCOUNTER — Encounter: Payer: Self-pay | Admitting: Gastroenterology

## 2020-11-11 ENCOUNTER — Ambulatory Visit: Payer: Medicare HMO | Admitting: Certified Registered"

## 2020-11-11 ENCOUNTER — Other Ambulatory Visit: Payer: Self-pay

## 2020-11-11 DIAGNOSIS — D122 Benign neoplasm of ascending colon: Secondary | ICD-10-CM | POA: Insufficient documentation

## 2020-11-11 DIAGNOSIS — K7460 Unspecified cirrhosis of liver: Secondary | ICD-10-CM | POA: Diagnosis not present

## 2020-11-11 DIAGNOSIS — K2289 Other specified disease of esophagus: Secondary | ICD-10-CM | POA: Insufficient documentation

## 2020-11-11 DIAGNOSIS — K766 Portal hypertension: Secondary | ICD-10-CM | POA: Diagnosis not present

## 2020-11-11 DIAGNOSIS — K317 Polyp of stomach and duodenum: Secondary | ICD-10-CM | POA: Diagnosis not present

## 2020-11-11 DIAGNOSIS — Z1211 Encounter for screening for malignant neoplasm of colon: Secondary | ICD-10-CM | POA: Diagnosis not present

## 2020-11-11 DIAGNOSIS — I10 Essential (primary) hypertension: Secondary | ICD-10-CM | POA: Diagnosis not present

## 2020-11-11 DIAGNOSIS — K640 First degree hemorrhoids: Secondary | ICD-10-CM | POA: Diagnosis not present

## 2020-11-11 DIAGNOSIS — K5730 Diverticulosis of large intestine without perforation or abscess without bleeding: Secondary | ICD-10-CM | POA: Insufficient documentation

## 2020-11-11 DIAGNOSIS — Z7984 Long term (current) use of oral hypoglycemic drugs: Secondary | ICD-10-CM | POA: Diagnosis not present

## 2020-11-11 DIAGNOSIS — D123 Benign neoplasm of transverse colon: Secondary | ICD-10-CM | POA: Diagnosis not present

## 2020-11-11 DIAGNOSIS — D124 Benign neoplasm of descending colon: Secondary | ICD-10-CM | POA: Diagnosis not present

## 2020-11-11 DIAGNOSIS — E119 Type 2 diabetes mellitus without complications: Secondary | ICD-10-CM | POA: Diagnosis not present

## 2020-11-11 DIAGNOSIS — R634 Abnormal weight loss: Secondary | ICD-10-CM | POA: Insufficient documentation

## 2020-11-11 HISTORY — DX: Alcohol dependence, uncomplicated: F10.20

## 2020-11-11 HISTORY — DX: Essential (primary) hypertension: I10

## 2020-11-11 HISTORY — PX: COLONOSCOPY WITH PROPOFOL: SHX5780

## 2020-11-11 HISTORY — PX: ESOPHAGOGASTRODUODENOSCOPY: SHX5428

## 2020-11-11 HISTORY — DX: Abnormal weight loss: R63.4

## 2020-11-11 HISTORY — DX: Type 2 diabetes mellitus without complications: E11.9

## 2020-11-11 LAB — GLUCOSE, CAPILLARY: Glucose-Capillary: 115 mg/dL — ABNORMAL HIGH (ref 70–99)

## 2020-11-11 SURGERY — COLONOSCOPY WITH PROPOFOL
Anesthesia: General

## 2020-11-11 SURGERY — Surgical Case
Anesthesia: *Unknown

## 2020-11-11 MED ORDER — EPHEDRINE SULFATE 50 MG/ML IJ SOLN
INTRAMUSCULAR | Status: DC | PRN
  Administered 2020-11-11: 10 mg via INTRAVENOUS

## 2020-11-11 MED ORDER — LIDOCAINE HCL (CARDIAC) PF 100 MG/5ML IV SOSY
PREFILLED_SYRINGE | INTRAVENOUS | Status: DC | PRN
  Administered 2020-11-11: 100 mg via INTRAVENOUS

## 2020-11-11 MED ORDER — GLYCOPYRROLATE 0.2 MG/ML IJ SOLN
INTRAMUSCULAR | Status: DC | PRN
  Administered 2020-11-11 (×2): .2 mg via INTRAVENOUS

## 2020-11-11 MED ORDER — PROPOFOL 500 MG/50ML IV EMUL
INTRAVENOUS | Status: DC | PRN
  Administered 2020-11-11: 145 ug/kg/min via INTRAVENOUS

## 2020-11-11 MED ORDER — STERILE WATER FOR IRRIGATION IR SOLN
Status: DC | PRN
  Administered 2020-11-11 (×2): 50 mL

## 2020-11-11 MED ORDER — SODIUM CHLORIDE 0.9 % IV SOLN: INTRAVENOUS | Status: DC

## 2020-11-11 MED ORDER — PROPOFOL 10 MG/ML IV BOLUS
INTRAVENOUS | Status: DC | PRN
  Administered 2020-11-11: 20 mg via INTRAVENOUS
  Administered 2020-11-11: 10 mg via INTRAVENOUS
  Administered 2020-11-11: 60 mg via INTRAVENOUS
  Administered 2020-11-11: 10 mg via INTRAVENOUS

## 2020-11-11 NOTE — Op Note (Signed)
[facility] Patient Name: Christopher Huff Procedure Date: 11/11/2020 9:10 AM MRN: [id number] Account #: [id number] Date of Birth: [birth year] Admit Type: Outpatient Age: 65 Room: [facility] ENDO ROOM 1 Gender: Male Note Status: [place] Instrument Name: Colonoscope [id number] Procedure:             Colonoscopy Indications:           Screening for colorectal malignant neoplasm Providers:             Rueben Bash, DO Referring MD:          [place]:             Monitored Anesthesia Care Complications:         No immediate complications. Estimated blood loss:                         Minimal. Procedure:             Pre-Anesthesia Assessment:                        - Prior to the procedure, a History and Physical was                         performed, and patient medications and allergies were                         reviewed. The patient is competent. The risks and                         benefits of the procedure and the sedation options and                         risks were discussed with the patient. All questions                         were answered and informed consent was obtained.                         Patient identification and proposed procedure were                         verified by the physician, the nurse, the anesthetist                         and the technician in the endoscopy suite. Mental                         Status Examination: alert and oriented. Airway                         Examination: normal oropharyngeal airway and neck                         mobility. Respiratory Examination: clear to                         auscultation. CV Examination: systolic murmur.  Prophylactic Antibiotics: The patient does not require                         prophylactic antibiotics. Prior Anticoagulants: The                         patient has taken no previous anticoagulant or                          antiplatelet agents. ASA Grade Assessment: II - A                         patient with mild systemic disease. After reviewing                         the risks and benefits, the patient was deemed in                         satisfactory condition to undergo the procedure. The                         anesthesia plan was to use monitored anesthesia care                         (MAC). Immediately prior to administration of                         medications, the patient was re-assessed for adequacy                         to receive sedatives. The heart rate, respiratory                         rate, oxygen saturations, blood pressure, adequacy of                         pulmonary ventilation, and response to care were                         monitored throughout the procedure. The physical                         status of the patient was re-assessed after the                         procedure.                        After obtaining informed consent, the colonoscope was                         passed under direct vision. Throughout the procedure,                         the patient's blood pressure, pulse, and oxygen                         saturations were monitored continuously. The  Colonoscope was introduced through the anus and                         advanced to the the terminal ileum, with                         identification of the appendiceal orifice and IC                         valve. The colonoscopy was performed without                         difficulty. The patient tolerated the procedure well.                         The quality of the bowel preparation was evaluated                         using the BBPS [facility] Bowel Preparation Scale) with                         scores of: Right Colon = 3, Transverse Colon = 3 and                         Left Colon = 3 (entire mucosa seen well with no                         residual staining, small fragments of  stool or opaque                         liquid). The total BBPS score equals 9. The terminal                         ileum, ileocecal valve, appendiceal orifice, and                         rectum were photographed. Findings:      The perianal and digital rectal examinations were normal. Pertinent       negatives include normal sphincter tone.      The terminal ileum appeared normal. Estimated blood loss: none.      Multiple small-mouthed diverticula were found in the left colon.       Estimated blood loss was minimal.      Non-bleeding internal hemorrhoids were found during retroflexion. The       hemorrhoids were Grade I (internal hemorrhoids that do not prolapse).      A 6 to 7 mm polyp was found in the ascending colon. The polyp was       sessile. The polyp was removed with a cold snare. Resection and       retrieval were complete. Estimated blood loss was minimal.      A 2 to 3 mm polyp was found in the transverse colon. The polyp was       sessile. The polyp was removed with a cold biopsy forceps. Resection and       retrieval were complete. Estimated blood loss was minimal.      Two sessile polyps were found in the transverse colon. The polyps were  6       to 7 mm in size. These polyps were removed with a cold snare. Resection       and retrieval were complete. Estimated blood loss was minimal.      Three sessile polyps were found in the descending colon. The polyps were       2 to 3 mm in size. These polyps were removed with a cold biopsy forceps.       Resection and retrieval were complete. Estimated blood loss was minimal.      The exam was otherwise without abnormality on direct and retroflexion       views. Impression:            - The examined portion of the ileum was normal.                        - Diverticulosis in the left colon.                        - Non-bleeding internal hemorrhoids.                        - One 6 to 7 mm polyp in the ascending colon, removed                          with a cold snare. Resected and retrieved.                        - One 2 to 3 mm polyp in the transverse colon, removed                         with a cold biopsy forceps. Resected and retrieved.                        - Two 6 to 7 mm polyps in the transverse colon,                         removed with a cold snare. Resected and retrieved.                        - Three 2 to 3 mm polyps in the descending colon,                         removed with a cold biopsy forceps. Resected and                         retrieved.                        - The examination was otherwise normal on direct and                         retroflexion views. Recommendation:        - Discharge patient to home.                        - Resume previous diet.                        -  Continue present medications.                        - No aspirin, ibuprofen, naproxen, or other                         non-steroidal anti-inflammatory drugs for 5 days after                         polyp removal.                        - Await pathology results.                        - Repeat colonoscopy for surveillance based on                         pathology results.                        - Return to GI office as previously scheduled. Procedure Code(s):     --- Professional ---                        [phone], Colonoscopy, flexible; with removal of                         tumor(s), polyp(s), or other lesion(s) by snare                         technique                        45380, 26, Colonoscopy, flexible; with biopsy, single                         or multiple Diagnosis Code(s):     --- Professional ---                        K63.5, Polyp of colon                        Z12.11, Encounter for screening for malignant neoplasm                         of colon                        K64.0, First degree hemorrhoids                        K57.30, Diverticulosis of large intestine without                          perforation or abscess without bleeding CPT copyright 2019 American Medical Association. All rights reserved. The codes documented in this report are preliminary and upon coder review may  be revised to meet current compliance requirements. Attending Participation:      I personally performed the entire procedure. Volney American, DO Annamaria Helling DO, DO 11/11/2020 10:47:50 AM This report has been signed electronically. Number of Addenda: 0 Note Initiated On: 11/11/2020  9:10 AM Scope Withdrawal Time: 0 hours 25 minutes 29 seconds  Total Procedure Duration: 0 hours 33 minutes 39 seconds  Estimated Blood Loss:  Estimated blood loss was minimal.      [facility]

## 2020-11-11 NOTE — Transfer of Care (Signed)
Immediate Anesthesia Transfer of Care Note  Patient: Christopher Huff  Procedure(s) Performed: COLONOSCOPY WITH PROPOFOL ESOPHAGOGASTRODUODENOSCOPY (EGD)  Patient Location: Endoscopy Unit  Anesthesia Type:General  Level of Consciousness: drowsy and patient cooperative  Airway & Oxygen Therapy: Patient Spontanous Breathing and Patient connected to face mask oxygen  Post-op Assessment: Report given to RN and Post -op Vital signs reviewed and stable  Post vital signs: Reviewed and stable  Last Vitals:  Vitals Value Taken Time  BP 116/65 11/11/20 1034  Temp    Pulse 63 11/11/20 1037  Resp 13 11/11/20 1037  SpO2 100 % 11/11/20 1037  Vitals shown include unvalidated device data.  Last Pain:  Vitals:   11/11/20 1035  TempSrc:   PainSc: 0-No pain         Complications: No notable events documented.

## 2020-11-11 NOTE — Anesthesia Preprocedure Evaluation (Signed)
Anesthesia Evaluation  Patient identified by MRN, date of birth, ID band Patient awake    Reviewed: Allergy & Precautions, NPO status , Patient's Chart, lab work & pertinent test results  Airway Mallampati: II  TM Distance: >3 FB Neck ROM: full    Dental  (+) Teeth Intact   Pulmonary neg pulmonary ROS,    Pulmonary exam normal breath sounds clear to auscultation       Cardiovascular Exercise Tolerance: Good hypertension, negative cardio ROS Normal cardiovascular exam Rhythm:Regular Rate:Normal     Neuro/Psych negative neurological ROS  negative psych ROS   GI/Hepatic negative GI ROS, Neg liver ROS,   Endo/Other  negative endocrine ROSdiabetes, Well Controlled, Type 2, Oral Hypoglycemic Agents  Renal/GU negative Renal ROS  negative genitourinary   Musculoskeletal negative musculoskeletal ROS (+)   Abdominal Normal abdominal exam  (+)   Peds negative pediatric ROS (+)  Hematology negative hematology ROS (+)   Anesthesia Other Findings Past Medical History: No date: Alcohol dependence (HCC) No date: Diabetes mellitus without complication (HCC) No date: Hypertension No date: Weight loss  Past Surgical History: No date: COLONOSCOPY No date: TONSILLECTOMY  BMI    Body Mass Index: 18.95 kg/m      Reproductive/Obstetrics negative OB ROS                             Anesthesia Physical Anesthesia Plan  ASA: 2  Anesthesia Plan: General   Post-op Pain Management:    Induction: Intravenous  PONV Risk Score and Plan: Propofol infusion and TIVA  Airway Management Planned: Nasal Cannula  Additional Equipment:   Intra-op Plan:   Post-operative Plan:   Informed Consent: I have reviewed the patients History and Physical, chart, labs and discussed the procedure including the risks, benefits and alternatives for the proposed anesthesia with the patient or authorized representative  who has indicated his/her understanding and acceptance.     Dental Advisory Given  Plan Discussed with: CRNA and Surgeon  Anesthesia Plan Comments:         Anesthesia Quick Evaluation

## 2020-11-11 NOTE — Interval H&P Note (Signed)
History and Physical Interval Note: Preprocedure H&P from 11/11/20  was reviewed and there was no interval change after seeing and examining the patient.  Written consent was obtained from the patient after discussion of risks, benefits, and alternatives. Patient has consented to proceed with Esophagogastroduodenoscopy and Colonoscopy with possible intervention   11/11/2020 9:24 AM  Christopher Huff  has presented today for surgery, with the diagnosis of Cirrhosis of liver  h/o colon polyps abnormal weight loss.  The various methods of treatment have been discussed with the patient and family. After consideration of risks, benefits and other options for treatment, the patient has consented to  Procedure(s) with comments: COLONOSCOPY WITH PROPOFOL (N/A) - DM ESOPHAGOGASTRODUODENOSCOPY (EGD) (N/A) as a surgical intervention.  The patient's history has been reviewed, patient examined, no change in status, stable for surgery.  I have reviewed the patient's chart and labs.  Questions were answered to the patient's satisfaction.     Annamaria Helling

## 2020-11-11 NOTE — Op Note (Signed)
[facility] Patient Name: Christopher Huff Procedure Date: 11/11/2020 9:10 AM MRN: [id number] Account #: [id number] Date of Birth: 1955/03/24 Admit Type: Outpatient Age: 65 Room: [facility] ENDO ROOM 1 Gender: Male Note Status: Finalized Instrument Name: Upper Endoscope [id number] Procedure:             Upper GI endoscopy Indications:           Cirrhosis rule out esophageal varices Providers:             Rueben Bash, DO Referring MD:          [employer]:             Monitored Anesthesia Care Complications:         No immediate complications. Estimated blood loss:                         Minimal. Procedure:             Pre-Anesthesia Assessment:                        - Prior to the procedure, a History and Physical was                         performed, and patient medications and allergies were                         reviewed. The patient is competent. The risks and                         benefits of the procedure and the sedation options and                         risks were discussed with the patient. All questions                         were answered and informed consent was obtained.                         Patient identification and proposed procedure were                         verified by the physician, the nurse, the anesthetist                         and the technician in the endoscopy suite. Mental                         Status Examination: alert and oriented. Airway                         Examination: normal oropharyngeal airway and neck                         mobility. Respiratory Examination: clear to                         auscultation. CV Examination: systolic murmur.  Prophylactic Antibiotics: The patient does not require                         prophylactic antibiotics. Prior Anticoagulants: The                         patient has taken no previous anticoagulant or                          antiplatelet agents. ASA Grade Assessment: II - A                         patient with mild systemic disease. After reviewing                         the risks and benefits, the patient was deemed in                         satisfactory condition to undergo the procedure. The                         anesthesia plan was to use monitored anesthesia care                         (MAC). Immediately prior to administration of                         medications, the patient was re-assessed for adequacy                         to receive sedatives. The heart rate, respiratory                         rate, oxygen saturations, blood pressure, adequacy of                         pulmonary ventilation, and response to care were                         monitored throughout the procedure. The physical                         status of the patient was re-assessed after the                         procedure.                        After obtaining informed consent, the endoscope was                         passed under direct vision. Throughout the procedure,                         the patient's blood pressure, pulse, and oxygen                         saturations were monitored continuously. The Endoscope  was introduced through the mouth, and advanced to the                         second part of duodenum. The upper GI endoscopy was                         accomplished without difficulty. The patient tolerated                         the procedure well. The upper GI endoscopy was                         accomplished without difficulty. The patient tolerated                         the procedure well. Findings:      The duodenal bulb, first portion of the duodenum and second portion of       the duodenum were normal. Biopsies for histology were taken with a cold       forceps for evaluation of celiac disease. Estimated blood loss was       minimal.      A single 2 to 4 mm  sessile polyp with no bleeding was found in the       second portion of the duodenum. Biopsies were taken with a cold forceps       for histology. Estimated blood loss was minimal.      The exam of the duodenum was otherwise normal.      Mild portal hypertensive gastropathy was found in the gastric body.       Biopsies were taken with a cold forceps for Helicobacter pylori testing.       Estimated blood loss was minimal.      The exam of the stomach was otherwise normal.      The Z-line was irregular and was found 40 cm from the incisors. Biopsies       were taken with a cold forceps for histology. C0M4- biopsied at 40, 38,       and 36 cm to rule out Barretts esophagus Estimated blood loss was       minimal. No signs of esophageal varices      The exam of the esophagus was otherwise normal. Impression:            - Normal duodenal bulb, first portion of the duodenum                         and second portion of the duodenum. Biopsied.                        - A single duodenal polyp. Biopsied.                        - Portal hypertensive gastropathy. Biopsied.                        - Z-line irregular, 40 cm from the incisors. Biopsied. Recommendation:        - Discharge patient to home.                        - Resume  previous diet.                        - Continue present medications.                        - Await pathology results.                        - Repeat upper endoscopy for surveillance based on                         pathology results.                        - Return to GI office as previously scheduled. Procedure Code(s):     --- Professional ---                        [phone], Esophagogastroduodenoscopy, flexible,                         transoral; with biopsy, single or multiple Diagnosis Code(s):     --- Professional ---                        K31.7, Polyp of stomach and duodenum                        K76.6, Portal hypertension                        K31.89, Other  diseases of stomach and duodenum                        K22.8, Other specified diseases of esophagus                        K74.60, Unspecified cirrhosis of liver CPT copyright 2019 American Medical Association. All rights reserved. The codes documented in this report are preliminary and upon coder review may  be revised to meet current compliance requirements. Attending Participation:      I personally performed the entire procedure. Volney American, DO Annamaria Helling DO, DO 11/11/2020 10:42:03 AM This report has been signed electronically. Number of Addenda: 0 Note Initiated On: 11/11/2020 9:10 AM Estimated Blood Loss:  Estimated blood loss was minimal.      [facility]

## 2020-11-11 NOTE — Anesthesia Procedure Notes (Signed)
Procedure Name: General with mask airway Date/Time: 11/11/2020 9:38 AM Performed by: Kelton Pillar, CRNA Pre-anesthesia Checklist: Patient identified, Emergency Drugs available, Suction available and Patient being monitored Patient Re-evaluated:Patient Re-evaluated prior to induction Oxygen Delivery Method: Simple face mask Induction Type: IV induction Placement Confirmation: positive ETCO2 and CO2 detector Dental Injury: Teeth and Oropharynx as per pre-operative assessment

## 2020-11-11 NOTE — Anesthesia Postprocedure Evaluation (Signed)
Anesthesia Post Note  Patient: Christopher Huff  Procedure(s) Performed: COLONOSCOPY WITH PROPOFOL ESOPHAGOGASTRODUODENOSCOPY (EGD)  Patient location during evaluation: PACU Anesthesia Type: General Level of consciousness: awake and awake and alert Pain management: pain level controlled Vital Signs Assessment: post-procedure vital signs reviewed and stable Respiratory status: spontaneous breathing and nonlabored ventilation Cardiovascular status: blood pressure returned to baseline Anesthetic complications: no   No notable events documented.   Last Vitals:  Vitals:   11/11/20 1101 11/11/20 1104  BP:  (!) 111/50  Pulse:    Resp: 14   Temp:    SpO2:      Last Pain:  Vitals:   11/11/20 1101  TempSrc:   PainSc: 0-No pain                 VAN STAVEREN,Kanisha Duba

## 2020-11-14 ENCOUNTER — Encounter: Payer: Self-pay | Admitting: Gastroenterology

## 2020-11-14 LAB — SURGICAL PATHOLOGY

## 2020-11-16 ENCOUNTER — Ambulatory Visit
Admission: RE | Admit: 2020-11-16 | Discharge: 2020-11-16 | Disposition: A | Discharge status: Home / Self Care | Payer: Medicare HMO | Source: Ambulatory Visit | Attending: Gastroenterology | Admitting: Gastroenterology

## 2020-11-16 ENCOUNTER — Other Ambulatory Visit: Payer: Self-pay

## 2020-11-16 DIAGNOSIS — R634 Abnormal weight loss: Secondary | ICD-10-CM | POA: Diagnosis present

## 2020-11-16 DIAGNOSIS — K7460 Unspecified cirrhosis of liver: Secondary | ICD-10-CM | POA: Insufficient documentation

## 2020-11-16 MED ORDER — GADOBUTROL 1 MMOL/ML IV SOLN
6.0000 mL | Freq: Once | INTRAVENOUS | Status: AC | PRN
  Administered 2020-11-16: 6 mL via INTRAVENOUS

## 2020-11-29 ENCOUNTER — Other Ambulatory Visit: Payer: Self-pay | Admitting: Infectious Diseases

## 2020-11-29 DIAGNOSIS — R634 Abnormal weight loss: Secondary | ICD-10-CM

## 2020-11-29 DIAGNOSIS — I350 Nonrheumatic aortic (valve) stenosis: Secondary | ICD-10-CM

## 2020-11-29 DIAGNOSIS — K7460 Unspecified cirrhosis of liver: Secondary | ICD-10-CM

## 2020-12-05 ENCOUNTER — Other Ambulatory Visit: Payer: Self-pay | Admitting: Infectious Diseases

## 2020-12-05 DIAGNOSIS — K7460 Unspecified cirrhosis of liver: Secondary | ICD-10-CM

## 2020-12-05 DIAGNOSIS — I350 Nonrheumatic aortic (valve) stenosis: Secondary | ICD-10-CM

## 2020-12-05 DIAGNOSIS — R634 Abnormal weight loss: Secondary | ICD-10-CM

## 2020-12-06 ENCOUNTER — Inpatient Hospital Stay: Payer: Medicare HMO | Attending: Oncology | Admitting: Oncology

## 2020-12-06 ENCOUNTER — Inpatient Hospital Stay: Payer: Medicare HMO

## 2020-12-06 ENCOUNTER — Encounter: Payer: Self-pay | Admitting: Oncology

## 2020-12-06 ENCOUNTER — Other Ambulatory Visit: Payer: Self-pay

## 2020-12-06 VITALS — BP 125/75 | HR 66 | Temp 97.6°F | Wt 121.0 lb

## 2020-12-06 DIAGNOSIS — R634 Abnormal weight loss: Secondary | ICD-10-CM | POA: Diagnosis not present

## 2020-12-06 DIAGNOSIS — K7030 Alcoholic cirrhosis of liver without ascites: Secondary | ICD-10-CM | POA: Diagnosis not present

## 2020-12-06 DIAGNOSIS — R197 Diarrhea, unspecified: Secondary | ICD-10-CM | POA: Insufficient documentation

## 2020-12-06 DIAGNOSIS — E538 Deficiency of other specified B group vitamins: Secondary | ICD-10-CM | POA: Diagnosis not present

## 2020-12-06 DIAGNOSIS — D649 Anemia, unspecified: Secondary | ICD-10-CM

## 2020-12-06 DIAGNOSIS — D696 Thrombocytopenia, unspecified: Secondary | ICD-10-CM

## 2020-12-06 DIAGNOSIS — R748 Abnormal levels of other serum enzymes: Secondary | ICD-10-CM | POA: Diagnosis not present

## 2020-12-06 DIAGNOSIS — Z806 Family history of leukemia: Secondary | ICD-10-CM | POA: Diagnosis not present

## 2020-12-06 DIAGNOSIS — Z7984 Long term (current) use of oral hypoglycemic drugs: Secondary | ICD-10-CM | POA: Diagnosis not present

## 2020-12-06 LAB — LACTATE DEHYDROGENASE: LDH: 115 U/L (ref 98–192)

## 2020-12-06 LAB — CBC WITH DIFFERENTIAL/PLATELET
Abs Immature Granulocytes: 0.01 10*3/uL (ref 0.00–0.07)
Basophils Absolute: 0.1 10*3/uL (ref 0.0–0.1)
Basophils Relative: 2 %
Eosinophils Absolute: 0.1 10*3/uL (ref 0.0–0.5)
Eosinophils Relative: 2 %
HCT: 39 % (ref 39.0–52.0)
Hemoglobin: 13.4 g/dL (ref 13.0–17.0)
Immature Granulocytes: 0 %
Lymphocytes Relative: 26 %
Lymphs Abs: 1.1 10*3/uL (ref 0.7–4.0)
MCH: 34.4 pg — ABNORMAL HIGH (ref 26.0–34.0)
MCHC: 34.4 g/dL (ref 30.0–36.0)
MCV: 100.3 fL — ABNORMAL HIGH (ref 80.0–100.0)
Monocytes Absolute: 0.5 10*3/uL (ref 0.1–1.0)
Monocytes Relative: 11 %
Neutro Abs: 2.4 10*3/uL (ref 1.7–7.7)
Neutrophils Relative %: 59 %
Platelets: 193 10*3/uL (ref 150–400)
RBC: 3.89 MIL/uL — ABNORMAL LOW (ref 4.22–5.81)
RDW: 13.9 % (ref 11.5–15.5)
WBC: 4.1 10*3/uL (ref 4.0–10.5)
nRBC: 0 % (ref 0.0–0.2)

## 2020-12-06 LAB — TECHNOLOGIST SMEAR REVIEW
Plt Morphology: NORMAL
RBC MORPHOLOGY: NORMAL
WBC MORPHOLOGY: NORMAL

## 2020-12-06 LAB — GAMMA GT: GGT: 141 U/L — ABNORMAL HIGH (ref 7–50)

## 2020-12-06 LAB — VITAMIN B12: Vitamin B-12: 678 pg/mL (ref 180–914)

## 2020-12-06 LAB — IMMATURE PLATELET FRACTION: Immature Platelet Fraction: 6.1 % (ref 1.2–8.6)

## 2020-12-06 NOTE — Progress Notes (Signed)
Hematology/Oncology Consult note [phone] [phone]      Patient Care Team: Leonel Ramsay, MD as PCP - General (Infectious Diseases)  REFERRING PROVIDER: Leonel Ramsay, MD  CHIEF COMPLAINTS/REASON FOR VISIT:  Evaluation of weight loss, anemia, thrombocytopenia  HISTORY OF PRESENTING ILLNESS:   Christopher Huff is a  65 y.o.  male with PMH listed below was seen in consultation at the request of  Leonel Ramsay, MD  for evaluation of weight loss, anemia, thrombocytopenia  Patient has a history of alcohol dependence, status post cessation in August 2022. 08/16/2020 CT abdomen pelvis with contrast showed which showed cirrhosis with mild is ascites.  Prominent vascularity along the distal aspect of the esophagus suspicious for varices.  duodenitis.   11/06/2020, MRI abdomen with and without contrast showed cirrhotic hepatic morphology.  Portal hypertension.  Splenomegaly.  No arterially enhancing hepatic lesion.  No pancreatic lesion  He has noted 50 pound weight loss over the past 1 year.  Appetite is fair.  Denies any nausea vomiting diarrhea, coffee-ground emesis, hematemesis abdominal pain, dysphagia. He has CT chest without contrast scheduled for work-up of unintentional weight loss. 11/29/2020, CBC showed mild anemia with a hemoglobin of 13.1, MCV 99.2, platelet count 146,000.  Slight leukopenia with total WBC 3.8, normal differential.  Previous work-up results include Acute hepatitis panel negative.  Normal ferritin level.  Normal thyroid function He has vitamin B12 deficiency, vitamin D level was decreased at 240.  Patient has been on vitamin B-12 injections as well as oral B12 supplements.   Denies night sweats, fever. Review of Systems  Constitutional:  Positive for unexpected weight change. Negative for appetite change, chills, fatigue and fever.  HENT:   Negative for hearing loss and voice change.   Eyes:  Negative for eye problems and  icterus.  Respiratory:  Negative for chest tightness, cough and shortness of breath.   Cardiovascular:  Negative for chest pain and leg swelling.  Gastrointestinal:  Negative for abdominal distention and abdominal pain.  Endocrine: Negative for hot flashes.  Genitourinary:  Negative for difficulty urinating, dysuria and frequency.   Musculoskeletal:  Negative for arthralgias.  Skin:  Negative for itching and rash.  Neurological:  Negative for light-headedness and numbness.  Hematological:  Negative for adenopathy. Does not bruise/bleed easily.  Psychiatric/Behavioral:  Negative for confusion.    MEDICAL HISTORY:  Past Medical History:  Diagnosis Date   Alcohol dependence ([place])    Diabetes mellitus without complication ([place])    Hypertension    Weight loss     SURGICAL HISTORY: Past Surgical History:  Procedure Laterality Date   COLONOSCOPY     COLONOSCOPY WITH PROPOFOL N/A 11/11/2020   Procedure: COLONOSCOPY WITH PROPOFOL;  Surgeon: Annamaria Helling, DO;  [facility] ENDOSCOPY;  Service: Gastroenterology;  Laterality: N/A;  DM   ESOPHAGOGASTRODUODENOSCOPY N/A 11/11/2020   Procedure: ESOPHAGOGASTRODUODENOSCOPY (EGD);  Surgeon: Annamaria Helling, DO;  Location: [facility] ENDOSCOPY;  Service: Gastroenterology;  Laterality: N/A;   TONSILLECTOMY      SOCIAL HISTORY: Social History   Socioeconomic History   Marital status: Married    Spouse name: Not on file   Number of children: Not on file   Years of education: Not on file   Highest education level: Not on file  Occupational History   Not on file  Tobacco Use   Smoking status: Never   Smokeless tobacco: Current    Types: Snuff  Vaping Use   Vaping Use: Never used  Substance  and Sexual Activity   Alcohol use: Not Currently    Alcohol/week: 14.0 standard drinks    Types: 14 Cans of beer per week    Comment: stopped 08/2020   Drug use: Never   Sexual activity: Not on file  Other Topics Concern   Not on file   Social History Narrative   Not on file   Social Determinants of Health   Financial Resource Strain: Not on file  Food Insecurity: Not on file  Transportation Needs: Not on file  Physical Activity: Not on file  Stress: Not on file  Social Connections: Not on file  Intimate Partner Violence: Not on file    FAMILY HISTORY: Family History  Problem Relation Age of Onset   Heart attack Father    Leukemia Paternal Uncle     ALLERGIES:  has No Known Allergies.  MEDICATIONS:  Current Outpatient Medications  Medication Sig Dispense Refill   metFORMIN (GLUCOPHAGE) 500 MG tablet Take 1 tablet (500 mg total) by mouth 2 (two) times daily with a meal. 60 tablet 11   omeprazole (PRILOSEC) 20 MG capsule Take 20 mg by mouth daily.     vitamin B-12 (CYANOCOBALAMIN) 1000 MCG tablet Take 1,000 mcg by mouth daily.     No current facility-administered medications for this visit.     PHYSICAL EXAMINATION: ECOG PERFORMANCE STATUS: 1 - Symptomatic but completely ambulatory Vitals:   12/06/20 1114  BP: 125/75  Pulse: 66  Temp: 97.6 F (36.4 C)   Filed Weights   12/06/20 1114  Weight: 121 lb (54.9 kg)    Physical Exam Constitutional:      General: He is not in acute distress. HENT:     Head: Normocephalic and atraumatic.  Eyes:     General: No scleral icterus. Cardiovascular:     Rate and Rhythm: Normal rate and regular rhythm.     Heart sounds: Normal heart sounds.  Pulmonary:     Effort: Pulmonary effort is normal. No respiratory distress.     Breath sounds: No wheezing.  Abdominal:     General: Bowel sounds are normal. There is no distension.     Palpations: Abdomen is soft.  Musculoskeletal:        General: No deformity. Normal range of motion.     Cervical back: Normal range of motion and neck supple.  Skin:    General: Skin is warm and dry.     Findings: No erythema or rash.  Neurological:     Mental Status: He is alert and oriented to person, place, and time.  Mental status is at baseline.     Cranial Nerves: No cranial nerve deficit.     Coordination: Coordination normal.  Psychiatric:        Mood and Affect: Mood normal.    LABORATORY DATA:  I have reviewed the data as listed Lab Results  Component Value Date   WBC 4.1 12/06/2020   HGB 13.4 12/06/2020   HCT 39.0 12/06/2020   MCV 100.3 (H) 12/06/2020   PLT 193 12/06/2020   Recent Labs    08/16/20 1559  NA 131*  K 4.0  CL 94*  CO2 24  GLUCOSE 495*  BUN 10  CREATININE 0.67  CALCIUM 9.1  GFRNONAA >60  PROT 7.4  ALBUMIN 3.9  AST 33  ALT 21  ALKPHOS 166*  BILITOT 1.5*   Iron/TIBC/Ferritin/ %Sat No results found for: IRON, TIBC, FERRITIN, IRONPCTSAT    RADIOGRAPHIC STUDIES: I have personally reviewed the radiological images  as listed and agreed with the findings in the report. MR ABDOMEN WWO CONTRAST  Result Date: 11/16/2020 CLINICAL DATA:  Cirrhosis EXAM: MRI ABDOMEN WITHOUT AND WITH CONTRAST TECHNIQUE: Multiplanar multisequence MR imaging of the abdomen was performed both before and after the administration of intravenous contrast. CONTRAST:  52m GADAVIST GADOBUTROL 1 MMOL/ML IV SOLN COMPARISON:  CT August 16, 2020. FINDINGS: Lower chest: No acute abnormality. Hepatobiliary: Shrunken hepatic morphology with fibrosis and nodular hepatic contour. No arterially enhancing hepatic lesion. No cholelithiasis. Mild gallbladder wall thickening is favored sequela of chronic hepatocellular disease. No biliary ductal dilation. Pancreas: No pancreatic ductal dilation. Intrinsic T1 signal the pancreatic parenchyma is within normal limits. No cystic or solid hyperenhancing pancreatic lesion visualized. Spleen: Splenomegaly measuring 15 cm in maximum craniocaudal dimension. GDebarah Crapebodies within the spleen. Adrenals/Urinary Tract: Bilateral adrenal glands are unremarkable. No hydronephrosis. No solid enhancing renal mass. Stomach/Bowel: Visualized portions within the abdomen are unremarkable.  Vascular/Lymphatic: No abdominal aortic aneurysm. The portal, splenic and superior mesenteric veins are patent. Prominent upper abdominal lymph nodes are favored reactive. No pathologically enlarged abdominal or pelvic lymph nodes. Other:  Trace abdominal free fluid. Musculoskeletal: No suspicious bone lesions identified. IMPRESSION: 1. Cirrhotic hepatic morphology with evidence of portal hypertension including splenomegaly and trace abdominal free fluid. 2. No arterially enhancing hepatic lesion. Electronically Signed   By: JDahlia BailiffM.D.   On: 11/16/2020 13:55   [place]AST[place]APHY LIVER  Result Date: 11/16/2020 CLINICAL DATA:  Cirrhosis EXAM: [place]IVER ELASTOGRAPHY TECHNIQUE: Sonography of the liver was performed. In addition, ultrasound elastography evaluation of the liver was performed. A region of interest was placed within the right lobe of the liver. Following application of a compressive sonographic pulse, tissue compressibility was assessed. Multiple assessments were performed at the selected site. Median tissue compressibility was determined. Previously, hepatic stiffness was assessed by shear wave velocity. Based on recently published Society of Radiologists in Ultrasound consensus article, reporting is now recommended to be performed in the SI units of pressure (kiloPascals) representing hepatic stiffness/elasticity. The obtained result is compared to the published reference standards. (cACLD = compensated Advanced Chronic Liver Disease) COMPARISON:  CT August 16, 2020. FINDINGS: Liver: No focal lesion identified. Coarsened hepatic echotexture with nodular hepatic contour. Portal vein is patent on color Doppler imaging with normal direction of blood flow towards the liver. ULTRASOUND HEPATIC ELASTOGRAPHY Device: Siemens Helix VTQ Patient position: Supine Transducer: 5C1 Number of measurements: 10 Hepatic segment:  8 Median kPa: 28.9 IQR: 3.1 IQR/Median kPa ratio: 0.1 Data quality:  Good Diagnostic  category: > or =17 kPa: highly suggestive of cACLD with an increased probability of clinically significant portal hypertension The use of hepatic elastography is applicable to patients with viral hepatitis and non-alcoholic fatty liver disease. At this time, there is insufficient data for the referenced cut-off values and use in other causes of liver disease, including alcoholic liver disease. Patients, however, may be assessed by elastography and serve as their own reference standard/baseline. In patients with non-alcoholic liver disease, the values suggesting compensated advanced chronic liver disease (cACLD) may be lower, and patients may need additional testing with elasticity results of 7-9 kPa. Please note that abnormal hepatic elasticity and shear wave velocities may also be identified in clinical settings other than with hepatic fibrosis, such as: acute hepatitis, elevated right heart and central venous pressures including use of beta blockers, veno-occlusive disease ([place]udd-Chiari), infiltrative processes such as mastocytosis/amyloidosis/infiltrative tumor/lymphoma, extrahepatic cholestasis, with hyperemia in the post-prandial state, and with liver transplantation. Correlation  with patient history, laboratory data, and clinical condition recommended. Diagnostic Categories: < or =5 kPa: high probability of being normal < or =9 kPa: in the absence of other known clinical signs, rules out cACLD >9 kPa and ?13 kPa: suggestive of cACLD, but needs further testing >13 kPa: highly suggestive of cACLD > or =17 kPa: highly suggestive of cACLD with an increased probability of clinically significant portal hypertension IMPRESSION: ULTRASOUND LIVER: Cirrhotic hepatic morphology with trace perihepatic ascites. No overt hepatic lesion visualized. ULTRASOUND HEPATIC ELASTOGRAPHY: Median kPa:  28.9 Diagnostic category: > or =17 kPa: highly suggestive of cACLD with an increased probability of clinically significant portal  hypertension Electronically Signed   By: Dahlia Bailiff M.D.   On: 11/16/2020 13:45      ASSESSMENT & PLAN:  1. Anemia, unspecified type   2. Thrombocytopenia ([place])   3. B12 deficiency   4. Alcoholic cirrhosis, unspecified whether ascites present ([place])   5. Weight loss    #Macrocytic anemia, thrombocytopenia.  Could be due to chronic B12 deficiency.  Repeat B12 level, CBC, smear Check multiple myeloma panel, light chain ratio, flow cytometry, LDH, immature platelet fraction.  Elevated alkaline phosphatase.  Likely secondary to cirrhosis.  Check GGT. Unintentional weight loss, likely secondary to liver cirrhosis, diabetes and metformin use. Previous abdomen images showed no radiographic evidence of mass.  CT chest is scheduled.   Orders Placed This Encounter  Procedures   CBC with Differential/Platelet    Standing Status:   Future    Number of Occurrences:   1    Standing Expiration Date:   12/06/2021   Technologist smear review    Standing Status:   Future    Number of Occurrences:   1    Standing Expiration Date:   12/06/2021   Multiple Myeloma Panel (SPEP&IFE w/QIG)    Standing Status:   Future    Number of Occurrences:   1    Standing Expiration Date:   12/06/2021   Kappa/lambda light chains    Standing Status:   Future    Number of Occurrences:   1    Standing Expiration Date:   12/06/2021   Flow cytometry panel-leukemia/lymphoma work-up    Standing Status:   Future    Number of Occurrences:   1    Standing Expiration Date:   12/06/2021   Immature Platelet Fraction    Standing Status:   Future    Number of Occurrences:   1    Standing Expiration Date:   12/06/2021   Lactate dehydrogenase    Standing Status:   Future    Number of Occurrences:   1    Standing Expiration Date:   12/06/2021   Vitamin B12    Standing Status:   Future    Number of Occurrences:   1    Standing Expiration Date:   12/06/2021   Gamma GT    Standing Status:   Future    Number of Occurrences:    1    Standing Expiration Date:   12/06/2021    All questions were answered. The patient knows to call the clinic with any problems questions or concerns.  cc Leonel Ramsay, MD    Return of visit: 2 weeks to go over results.   Earlie Server, MD, PhD [facility] Hematology Oncology 12/06/2020

## 2020-12-07 LAB — KAPPA/LAMBDA LIGHT CHAINS
Kappa free light chain: 23.2 mg/L — ABNORMAL HIGH (ref 3.3–19.4)
Kappa, lambda light chain ratio: 1.17 (ref 0.26–1.65)
Lambda free light chains: 19.8 mg/L (ref 5.7–26.3)

## 2020-12-08 ENCOUNTER — Ambulatory Visit
Admission: RE | Admit: 2020-12-08 | Discharge: 2020-12-08 | Disposition: A | Discharge status: Home / Self Care | Payer: Medicare HMO | Attending: Internal Medicine | Admitting: Internal Medicine

## 2020-12-08 ENCOUNTER — Other Ambulatory Visit: Payer: Self-pay

## 2020-12-08 ENCOUNTER — Encounter
Admission: RE | Disposition: A | Discharge status: Home / Self Care | Payer: Self-pay | Source: Home / Self Care | Attending: Internal Medicine

## 2020-12-08 DIAGNOSIS — E782 Mixed hyperlipidemia: Secondary | ICD-10-CM | POA: Diagnosis not present

## 2020-12-08 DIAGNOSIS — R0602 Shortness of breath: Secondary | ICD-10-CM | POA: Diagnosis not present

## 2020-12-08 DIAGNOSIS — F17220 Nicotine dependence, chewing tobacco, uncomplicated: Secondary | ICD-10-CM | POA: Diagnosis not present

## 2020-12-08 DIAGNOSIS — I352 Nonrheumatic aortic (valve) stenosis with insufficiency: Secondary | ICD-10-CM | POA: Diagnosis present

## 2020-12-08 DIAGNOSIS — I10 Essential (primary) hypertension: Secondary | ICD-10-CM | POA: Insufficient documentation

## 2020-12-08 DIAGNOSIS — I350 Nonrheumatic aortic (valve) stenosis: Secondary | ICD-10-CM

## 2020-12-08 HISTORY — PX: RIGHT/LEFT HEART CATH AND CORONARY ANGIOGRAPHY: CATH118266

## 2020-12-08 LAB — COMP PANEL: LEUKEMIA/LYMPHOMA

## 2020-12-08 LAB — GLUCOSE, CAPILLARY
Glucose-Capillary: 229 mg/dL — ABNORMAL HIGH (ref 70–99)
Glucose-Capillary: 174 mg/dL — ABNORMAL HIGH (ref 70–99)

## 2020-12-08 SURGERY — RIGHT/LEFT HEART CATH AND CORONARY ANGIOGRAPHY
Anesthesia: Moderate Sedation

## 2020-12-08 MED ORDER — SODIUM CHLORIDE 0.9% FLUSH: 3.0000 mL | Freq: Two times a day (BID) | INTRAVENOUS | Status: DC

## 2020-12-08 MED ORDER — ACETAMINOPHEN 325 MG PO TABS: 650.0000 mg | ORAL_TABLET | ORAL | Status: DC | PRN

## 2020-12-08 MED ORDER — IOHEXOL 350 MG/ML SOLN
INTRAVENOUS | Status: DC | PRN
  Administered 2020-12-08: 107 mL

## 2020-12-08 MED ORDER — SODIUM CHLORIDE 0.9 % WEIGHT BASED INFUSION: 1.0000 mL/kg/h | INTRAVENOUS | Status: DC

## 2020-12-08 MED ORDER — ONDANSETRON HCL 4 MG/2ML IJ SOLN: 4.0000 mg | Freq: Four times a day (QID) | INTRAMUSCULAR | Status: DC | PRN

## 2020-12-08 MED ORDER — SODIUM CHLORIDE 0.9 % IV SOLN: 250.0000 mL | INTRAVENOUS | Status: DC | PRN

## 2020-12-08 MED ORDER — HEPARIN (PORCINE) IN NACL 2000-0.9 UNIT/L-% IV SOLN
INTRAVENOUS | Status: DC | PRN
  Administered 2020-12-08: 1000 mL

## 2020-12-08 MED ORDER — LIDOCAINE HCL (PF) 1 % IJ SOLN
INTRAMUSCULAR | Status: DC | PRN
  Administered 2020-12-08: 10 mL

## 2020-12-08 MED ORDER — FENTANYL CITRATE (PF) 100 MCG/2ML IJ SOLN
INTRAMUSCULAR | Status: AC
  Filled 2020-12-08: qty 2

## 2020-12-08 MED ORDER — SODIUM CHLORIDE 0.9 % WEIGHT BASED INFUSION
3.0000 mL/kg/h | INTRAVENOUS | Status: AC
  Administered 2020-12-08: 3 mL/kg/h via INTRAVENOUS

## 2020-12-08 MED ORDER — ASPIRIN 81 MG PO CHEW: 81.0000 mg | CHEWABLE_TABLET | ORAL | Status: DC

## 2020-12-08 MED ORDER — HYDRALAZINE HCL 20 MG/ML IJ SOLN: 10.0000 mg | INTRAMUSCULAR | Status: DC | PRN

## 2020-12-08 MED ORDER — MIDAZOLAM HCL 2 MG/2ML IJ SOLN
INTRAMUSCULAR | Status: DC | PRN
  Administered 2020-12-08: 1 mg via INTRAVENOUS

## 2020-12-08 MED ORDER — MIDAZOLAM HCL 2 MG/2ML IJ SOLN
INTRAMUSCULAR | Status: AC
  Filled 2020-12-08: qty 2

## 2020-12-08 MED ORDER — LABETALOL HCL 5 MG/ML IV SOLN: 10.0000 mg | INTRAVENOUS | Status: DC | PRN

## 2020-12-08 MED ORDER — HEPARIN (PORCINE) IN NACL 1000-0.9 UT/500ML-% IV SOLN
INTRAVENOUS | Status: AC
  Filled 2020-12-08: qty 1000

## 2020-12-08 MED ORDER — LIDOCAINE HCL 1 % IJ SOLN
INTRAMUSCULAR | Status: AC
  Filled 2020-12-08: qty 20

## 2020-12-08 MED ORDER — FENTANYL CITRATE (PF) 100 MCG/2ML IJ SOLN
INTRAMUSCULAR | Status: DC | PRN
  Administered 2020-12-08: 25 ug via INTRAVENOUS

## 2020-12-08 MED ORDER — SODIUM CHLORIDE 0.9% FLUSH: 3.0000 mL | INTRAVENOUS | Status: DC | PRN

## 2020-12-08 SURGICAL SUPPLY — 12 items
CATH INFINITI 5FR MULTPACK ANG (CATHETERS) ×2 IMPLANT
CATH SWAN GANZ 7F STRAIGHT (CATHETERS) ×2 IMPLANT
COVER EZ STRL 42X30 (DRAPES) ×2 IMPLANT
DEVICE CLOSURE MYNXGRIP 5F (Vascular Products) ×2 IMPLANT
NEEDLE PERC 18GX7CM (NEEDLE) ×2 IMPLANT
PACK CARDIAC CATH (CUSTOM PROCEDURE TRAY) ×2 IMPLANT
PROTECTION STATION PRESSURIZED (MISCELLANEOUS) ×2
SET ATX SIMPLICITY (MISCELLANEOUS) ×2 IMPLANT
SHEATH AVANTI 5FR X 11CM (SHEATH) ×2 IMPLANT
SHEATH AVANTI 7FRX11 (SHEATH) ×2 IMPLANT
STATION PROTECTION PRESSURIZED (MISCELLANEOUS) ×1 IMPLANT
WIRE GUIDERIGHT .035X150 (WIRE) ×2 IMPLANT

## 2020-12-09 ENCOUNTER — Encounter: Payer: Self-pay | Admitting: Internal Medicine

## 2020-12-09 LAB — MULTIPLE MYELOMA PANEL, SERUM
Albumin SerPl Elph-Mcnc: 3.8 g/dL (ref 2.9–4.4)
Albumin/Glob SerPl: 1.3 (ref 0.7–1.7)
Alpha 1: 0.2 g/dL (ref 0.0–0.4)
Alpha2 Glob SerPl Elph-Mcnc: 0.6 g/dL (ref 0.4–1.0)
B-Globulin SerPl Elph-Mcnc: 1.1 g/dL (ref 0.7–1.3)
Gamma Glob SerPl Elph-Mcnc: 1.2 g/dL (ref 0.4–1.8)
Globulin, Total: 3 g/dL (ref 2.2–3.9)
IgA: 403 mg/dL (ref 61–437)
IgG (Immunoglobin G), Serum: 1095 mg/dL (ref 603–1613)
IgM (Immunoglobulin M), Srm: 102 mg/dL (ref 20–172)
Total Protein ELP: 6.8 g/dL (ref 6.0–8.5)

## 2020-12-13 ENCOUNTER — Other Ambulatory Visit: Payer: Self-pay

## 2020-12-13 ENCOUNTER — Ambulatory Visit
Admission: RE | Admit: 2020-12-13 | Discharge: 2020-12-13 | Disposition: A | Discharge status: Home / Self Care | Payer: Medicare HMO | Source: Ambulatory Visit | Attending: Infectious Diseases | Admitting: Infectious Diseases

## 2020-12-13 DIAGNOSIS — I350 Nonrheumatic aortic (valve) stenosis: Secondary | ICD-10-CM | POA: Diagnosis present

## 2020-12-13 DIAGNOSIS — I2510 Atherosclerotic heart disease of native coronary artery without angina pectoris: Secondary | ICD-10-CM | POA: Insufficient documentation

## 2020-12-13 DIAGNOSIS — I700 Atherosclerosis of aorta: Secondary | ICD-10-CM | POA: Diagnosis not present

## 2020-12-13 DIAGNOSIS — K7460 Unspecified cirrhosis of liver: Secondary | ICD-10-CM | POA: Diagnosis not present

## 2020-12-13 DIAGNOSIS — R634 Abnormal weight loss: Secondary | ICD-10-CM | POA: Diagnosis present

## 2020-12-20 ENCOUNTER — Inpatient Hospital Stay: Payer: Medicare HMO | Admitting: Oncology

## 2020-12-20 ENCOUNTER — Encounter: Payer: Self-pay | Admitting: Oncology

## 2020-12-20 ENCOUNTER — Other Ambulatory Visit: Payer: Self-pay

## 2020-12-20 VITALS — BP 123/63 | HR 71 | Temp 98.0°F | Ht 67.0 in | Wt 123.9 lb

## 2020-12-20 DIAGNOSIS — R634 Abnormal weight loss: Secondary | ICD-10-CM

## 2020-12-20 DIAGNOSIS — K7030 Alcoholic cirrhosis of liver without ascites: Secondary | ICD-10-CM

## 2020-12-20 DIAGNOSIS — E538 Deficiency of other specified B group vitamins: Secondary | ICD-10-CM | POA: Diagnosis not present

## 2020-12-20 DIAGNOSIS — D696 Thrombocytopenia, unspecified: Secondary | ICD-10-CM | POA: Diagnosis not present

## 2020-12-20 DIAGNOSIS — D649 Anemia, unspecified: Secondary | ICD-10-CM | POA: Diagnosis not present

## 2020-12-20 NOTE — Progress Notes (Signed)
Hematology/Oncology  note [phone] [phone]      Patient Care Team: Leonel Ramsay, MD as PCP - General (Infectious Diseases)  REFERRING PROVIDER: Leonel Ramsay, MD  CHIEF COMPLAINTS/REASON FOR VISIT:  Evaluation of weight loss, anemia, thrombocytopenia  HISTORY OF PRESENTING ILLNESS:   Christopher Huff is a  65 y.o.  male with PMH listed below was seen in consultation at the request of  Leonel Ramsay, MD  for evaluation of weight loss, anemia, thrombocytopenia  Patient has a history of alcohol dependence, status post cessation in August 2022. 08/16/2020 CT abdomen pelvis with contrast showed which showed cirrhosis with mild is ascites.  Prominent vascularity along the distal aspect of the esophagus suspicious for varices.  duodenitis.   11/06/2020, MRI abdomen with and without contrast showed cirrhotic hepatic morphology.  Portal hypertension.  Splenomegaly.  No arterially enhancing hepatic lesion.  No pancreatic lesion  He has noted 50 pound weight loss over the past 1 year.  Appetite is fair.  Denies any nausea vomiting diarrhea, coffee-ground emesis, hematemesis abdominal pain, dysphagia. He has CT chest without contrast scheduled for work-up of unintentional weight loss. 11/29/2020, CBC showed mild anemia with a hemoglobin of 13.1, MCV 99.2, platelet count 146,000.  Slight leukopenia with total WBC 3.8, normal differential.  Previous work-up results include Acute hepatitis panel negative.  Normal ferritin level.  Normal thyroid function He has vitamin B12 deficiency, vitamin D level was decreased at 240.  Patient has been on vitamin B-12 injections as well as oral B12 supplements.   Denies night sweats, fever.    INTERVAL HISTORY Christopher Huff is a 65 y.o. male who has above history reviewed by me today presents for follow up visit for unintentional weight loss, anemia, thrombocytopenia. During the interval, patient has had CT chest without  contrast.  Earlier he has also had MRI abdomen with and without contrast and a CT abdomen pelvis with contrast done. He has gained a few pounds since last visit.  Today he has no new complaints.  Continues to have diarrhea  Review of Systems  Constitutional:  Positive for unexpected weight change. Negative for appetite change, chills, fatigue and fever.  HENT:   Negative for hearing loss and voice change.   Eyes:  Negative for eye problems and icterus.  Respiratory:  Negative for chest tightness, cough and shortness of breath.   Cardiovascular:  Negative for chest pain and leg swelling.  Gastrointestinal:  Positive for diarrhea. Negative for abdominal distention and abdominal pain.  Endocrine: Negative for hot flashes.  Genitourinary:  Negative for difficulty urinating, dysuria and frequency.   Musculoskeletal:  Negative for arthralgias.  Skin:  Negative for itching and rash.  Neurological:  Negative for light-headedness and numbness.  Hematological:  Negative for adenopathy. Does not bruise/bleed easily.  Psychiatric/Behavioral:  Negative for confusion.    MEDICAL HISTORY:  Past Medical History:  Diagnosis Date   Alcohol dependence ([place])    Diabetes mellitus without complication ([place])    Hypertension    Weight loss     SURGICAL HISTORY: Past Surgical History:  Procedure Laterality Date   COLONOSCOPY     COLONOSCOPY WITH PROPOFOL N/A 11/11/2020   Procedure: COLONOSCOPY WITH PROPOFOL;  Surgeon: Annamaria Helling, DO;  Location: [facility] ENDOSCOPY;  Service: Gastroenterology;  Laterality: N/A;  DM   ESOPHAGOGASTRODUODENOSCOPY N/A 11/11/2020   Procedure: ESOPHAGOGASTRODUODENOSCOPY (EGD);  Surgeon: Annamaria Helling, DO;  Location: [facility] ENDOSCOPY;  Service: Gastroenterology;  Laterality: N/A;   RIGHT/LEFT  HEART CATH AND CORONARY ANGIOGRAPHY N/A 12/08/2020   Procedure: RIGHT/LEFT HEART CATH AND CORONARY ANGIOGRAPHY;  Surgeon: Corey Skains, MD;  Location: [place] CV LAB;   Service: Cardiovascular;  Laterality: N/A;   TONSILLECTOMY      SOCIAL HISTORY: Social History   Socioeconomic History   Marital status: Married    Spouse name: Not on file   Number of children: Not on file   Years of education: Not on file   Highest education level: Not on file  Occupational History   Not on file  Tobacco Use   Smoking status: Never   Smokeless tobacco: Current    Types: Snuff  Vaping Use   Vaping Use: Never used  Substance and Sexual Activity   Alcohol use: Not Currently    Alcohol/week: 14.0 standard drinks    Types: 14 Cans of beer per week    Comment: stopped 08/2020   Drug use: Never   Sexual activity: Not on file  Other Topics Concern   Not on file  Social History Narrative   Not on file   Social Determinants of Health   Financial Resource Strain: Not on file  Food Insecurity: Not on file  Transportation Needs: Not on file  Physical Activity: Not on file  Stress: Not on file  Social Connections: Not on file  Intimate Partner Violence: Not on file    FAMILY HISTORY: Family History  Problem Relation Age of Onset   Heart attack Father    Leukemia Paternal Uncle     ALLERGIES:  has No Known Allergies.  MEDICATIONS:  Current Outpatient Medications  Medication Sig Dispense Refill   glimepiride (AMARYL) 1 MG tablet Take 2 mg by mouth daily.     metFORMIN (GLUCOPHAGE) 500 MG tablet Take 1 tablet (500 mg total) by mouth 2 (two) times daily with a meal. 60 tablet 11   omeprazole (PRILOSEC) 20 MG capsule Take 20 mg by mouth daily.     vitamin B-12 (CYANOCOBALAMIN) 1000 MCG tablet Take 1,000 mcg by mouth daily.     No current facility-administered medications for this visit.     PHYSICAL EXAMINATION: ECOG PERFORMANCE STATUS: 1 - Symptomatic but completely ambulatory Vitals:   12/20/20 1449  BP: 123/63  Pulse: 71  Temp: 98 F (36.7 C)  SpO2: 100%   Filed Weights   12/20/20 1449  Weight: 123 lb 14.4 oz (56.2 kg)    Physical  Exam Constitutional:      General: He is not in acute distress. HENT:     Head: Normocephalic and atraumatic.  Eyes:     General: No scleral icterus. Cardiovascular:     Rate and Rhythm: Normal rate and regular rhythm.     Heart sounds: Normal heart sounds.  Pulmonary:     Effort: Pulmonary effort is normal. No respiratory distress.     Breath sounds: No wheezing.  Abdominal:     General: Bowel sounds are normal. There is no distension.     Palpations: Abdomen is soft.  Musculoskeletal:        General: No deformity. Normal range of motion.     Cervical back: Normal range of motion and neck supple.  Skin:    General: Skin is warm and dry.     Findings: No erythema or rash.  Neurological:     Mental Status: He is alert and oriented to person, place, and time. Mental status is at baseline.     Cranial Nerves: No cranial nerve deficit.  Coordination: Coordination normal.  Psychiatric:        Mood and Affect: Mood normal.    LABORATORY DATA:  I have reviewed the data as listed Lab Results  Component Value Date   WBC 4.1 12/06/2020   HGB 13.4 12/06/2020   HCT 39.0 12/06/2020   MCV 100.3 (H) 12/06/2020   PLT 193 12/06/2020   Recent Labs    08/16/20 1559  NA 131*  K 4.0  CL 94*  CO2 24  GLUCOSE 495*  BUN 10  CREATININE 0.67  CALCIUM 9.1  GFRNONAA >60  PROT 7.4  ALBUMIN 3.9  AST 33  ALT 21  ALKPHOS 166*  BILITOT 1.5*    Iron/TIBC/Ferritin/ %Sat No results found for: IRON, TIBC, FERRITIN, IRONPCTSAT    RADIOGRAPHIC STUDIES: I have personally reviewed the radiological images as listed and agreed with the findings in the report. CT CHEST WO CONTRAST  Result Date: 12/14/2020 CLINICAL DATA:  65 year old male with history of cirrhosis. Weight loss. EXAM: CT CHEST WITHOUT CONTRAST TECHNIQUE: Multidetector CT imaging of the chest was performed following the standard protocol without IV contrast. COMPARISON:  No priors. FINDINGS: Cardiovascular: Heart size is  normal. There is no significant pericardial fluid, thickening or pericardial calcification. There is aortic atherosclerosis, as well as atherosclerosis of the great vessels of the mediastinum and the coronary arteries, including calcified atherosclerotic plaque in the left anterior descending and right coronary arteries. Very severe calcifications of the aortic valve are noted. Mediastinum/Nodes: No pathologically enlarged mediastinal or hilar lymph nodes. Please note that accurate exclusion of hilar adenopathy is limited on noncontrast CT scans. Esophagus is unremarkable in appearance. No axillary lymphadenopathy. Lungs/Pleura: No suspicious appearing pulmonary nodules or masses are noted. No acute consolidative airspace disease. No pleural effusions. Upper Abdomen: Liver has a shrunken appearance and nodular contour, compatible with reported clinical history of cirrhosis. Spleen is incompletely imaged but appears likely enlarged measuring up to 14.9 x 4.9 cm on axial images. Aortic atherosclerosis. Musculoskeletal: There are no aggressive appearing lytic or blastic lesions noted in the visualized portions of the skeleton. IMPRESSION: 1. No acute findings are noted in the thorax. 2. Morphologic changes in the liver compatible with reported clinical history of cirrhosis. There is also probable splenomegaly, which may suggest portal venous hypertension. 3. Extremely severe calcifications of the aortic valve. This is almost certainly associated with clinically significant aortic stenosis, potentially severe. Echocardiographic correlation for evidence of underlying valvular dysfunction is strongly recommended in the near future. 4. Aortic atherosclerosis, in addition to 2 vessel coronary artery disease. Please note that although the presence of coronary artery calcium documents the presence of coronary artery disease, the severity of this disease and any potential stenosis cannot be assessed on this non-gated CT  examination. Assessment for potential risk factor modification, dietary therapy or pharmacologic therapy may be warranted, if clinically indicated. Aortic Atherosclerosis (ICD10-I70.0). Electronically Signed   By: Vinnie Langton M.D.   On: 12/14/2020 10:17   CARDIAC CATHETERIZATION  Result Date: 12/08/2020   The left ventricular systolic function is normal.   LV end diastolic pressure is normal.   LV end diastolic pressure is normal.   The left ventricular ejection fraction is greater than 65% by visual estimate.   There is severe aortic valve stenosis. There is mild (2+) aortic regurgitation. 65 year old male with progression of the shortness of breath and severe aortic stenosis by echocardiogram with aortic insufficiency Normal LV systolic function with ejection fraction of 65% Severe aortic stenosis gnosis with a  peak gradient of 60 mm and mean gradient 26 mm Right heart cath showing pulmonary capillary wedge pressure of mean of 8 mm, PA pressure mean of 12 mm Plan Further evaluation and treatment options for the possibility of aortic valve replacement      ASSESSMENT & PLAN:  1. Alcoholic cirrhosis, unspecified whether ascites present ([place])   2. Weight loss   3. B12 deficiency   4. Thrombocytopenia ([place])    #Chronic macrocytosis MCV is improving since he stops alcohol use Patient has adequate vitamin B12 level.  Negative M protein, normal light chain ratio.  Normal LDH.   Hemoglobin is normal.  Continue monitor.  #Thrombocytopenia, resolved.  Previously low platelet count could be due to alcohol use/previous vitamin B12 deficiency.  Now B12 has normalized. No phenotypic abnormality on peripheral flow cytometry.  Increase CD4/CD8 ratio which is nonspecific.  Elevated alkaline phosphatase.  With elevated GGT.  This is likely due to liver cirrhosis. . Unintentional weight loss Previous CT, chest abdomen pelvis/MRI did not show radiographic evidence of mass.  Results were reviewed with  patient. I feel that the weight loss could be secondary to cirrhosis, diabetes and medication side effects.  His chronic intermittent diarrhea could be also secondary to  metformin use.  Recommend patient continue follow-up with primary care provider. Monitor for symptoms.   Orders Placed This Encounter  Procedures   CBC with Differential/Platelet    Standing Status:   Standing    Number of Occurrences:   1    Standing Expiration Date:   12/20/2021   Comprehensive metabolic panel    Standing Status:   Standing    Number of Occurrences:   1    Standing Expiration Date:   12/20/2021    All questions were answered. The patient knows to call the clinic with any problems questions or concerns.  cc Leonel Ramsay, MD    Return of visit: Follow-up in 6 months.   Earlie Server, MD, PhD [facility] Hematology Oncology 12/20/2020

## 2021-02-07 ENCOUNTER — Other Ambulatory Visit (HOSPITAL_COMMUNITY): Payer: Self-pay | Admitting: Gastroenterology

## 2021-02-07 ENCOUNTER — Other Ambulatory Visit: Payer: Self-pay | Admitting: Gastroenterology

## 2021-02-07 DIAGNOSIS — K7460 Unspecified cirrhosis of liver: Secondary | ICD-10-CM

## 2021-04-10 HISTORY — PX: ANOMALOUS PULMONARY VENOUS RETURN REPAIR, TOTAL: SHX1156

## 2021-04-26 ENCOUNTER — Encounter: Payer: Self-pay | Admitting: *Deleted

## 2021-04-26 ENCOUNTER — Encounter: Payer: Medicare HMO | Attending: Internal Medicine | Admitting: *Deleted

## 2021-04-26 DIAGNOSIS — Z952 Presence of prosthetic heart valve: Secondary | ICD-10-CM

## 2021-04-26 NOTE — Progress Notes (Signed)
Virtual orientation call completed today. he has an appointment on Date: [id number]  for EP eval and gym Orientation.  Documentation of diagnosis can be found in [facility]  Date: 04/09/2021 .   ?Christopher Huff is a current tobacco user. Intervention for tobacco cessation was provided at the initial medical review. He was asked about readiness to quit and reported he is slowly working on quitting . Patient was advised and educated about tobacco cessation using combination therapy, tobacco cessation classes, quit line, and quit smoking apps. Patient demonstrated understanding of this material. Staff will continue to provide encouragement and follow up with the patient throughout the program.   ?

## 2021-05-01 ENCOUNTER — Ambulatory Visit
Admission: RE | Admit: 2021-05-01 | Discharge: 2021-05-01 | Disposition: A | Discharge status: Home / Self Care | Payer: Medicare HMO | Source: Ambulatory Visit | Attending: Gastroenterology | Admitting: Gastroenterology

## 2021-05-01 DIAGNOSIS — K7460 Unspecified cirrhosis of liver: Secondary | ICD-10-CM | POA: Insufficient documentation

## 2021-05-02 ENCOUNTER — Encounter: Payer: Medicare HMO | Attending: Internal Medicine

## 2021-05-02 VITALS — Ht 69.3 in | Wt 114.8 lb

## 2021-05-02 DIAGNOSIS — Z952 Presence of prosthetic heart valve: Secondary | ICD-10-CM | POA: Diagnosis present

## 2021-05-02 DIAGNOSIS — Z79899 Other long term (current) drug therapy: Secondary | ICD-10-CM | POA: Insufficient documentation

## 2021-05-02 NOTE — Progress Notes (Signed)
Cardiac Individual Treatment Plan ? ?Patient Details  ?Name: Christopher Huff ?MRN: [id number] ?Date of Birth: [birth year] ?Referring Provider:   ?Flowsheet Row Cardiac Rehab from 05/02/2021 in [facility] Cardiac and Pulmonary Rehab  ?Referring Provider Serafina Royals MD  ? ?  ? ? ?Initial Encounter Date:  ?Flowsheet Row Cardiac Rehab from 05/02/2021 in [facility] Cardiac and Pulmonary Rehab  ?Date 05/02/21  ? ?  ? ? ?Visit Diagnosis: S/P TAVR (transcatheter aortic valve replacement) ? ?Patient's Home Medications on Admission: ? ?Current Outpatient Medications:  ?  aspirin 81 MG EC tablet, Take by mouth., Disp: , Rfl:  ?  empagliflozin (JARDIANCE) 25 MG TABS tablet, Take 1 tablet by mouth daily., Disp: , Rfl:  ?  glimepiride (AMARYL) 1 MG tablet, Take 2 mg by mouth daily., Disp: , Rfl:  ?  metFORMIN (GLUCOPHAGE) 500 MG tablet, Take 1 tablet (500 mg total) by mouth 2 (two) times daily with a meal., Disp: 60 tablet, Rfl: 11 ?  omeprazole (PRILOSEC) 20 MG capsule, Take 20 mg by mouth daily., Disp: , Rfl:  ?  vitamin B-12 (CYANOCOBALAMIN) 1000 MCG tablet, Take 1,000 mcg by mouth daily., Disp: , Rfl:  ? ?Past Medical History: ?Past Medical History:  ?Diagnosis Date  ? Alcohol dependence ([place])   ? Diabetes mellitus without complication ([place])   ? Hypertension   ? Weight loss   ? ? ?Tobacco Use: ?Social History  ? ?Tobacco Use  ?Smoking Status Never  ?Smokeless Tobacco Current  ? Types: Snuff  ?Tobacco Comments  ? Quit before surgery, surgery cancelled started back . Working on cessation   ? ? ?Labs: ?Review Flowsheet   ? ?    ? View : No data to display.  ?  ?  ?  ?  ?  ? ? ? ?Exercise Target Goals: ?Exercise Program Goal: ?Individual exercise prescription set using results from initial 6 min walk test and THRR while considering  patient?s activity barriers and safety.  ? ?Exercise Prescription Goal: ?Initial exercise prescription builds to 30-45 minutes a day of aerobic activity, 2-3 days per week.  Home exercise guidelines will be given to  patient during program as part of exercise prescription that the participant will acknowledge. ? ? ?Education: Aerobic Exercise: ?- Group verbal and visual presentation on the components of exercise prescription. Introduces F.I.T.T principle from ACSM for exercise prescriptions.  Reviews F.I.T.T. principles of aerobic exercise including progression. Written material given at graduation. ?Flowsheet Row Cardiac Rehab from 05/02/2021 in [facility] Cardiac and Pulmonary Rehab  ?Education need identified 05/02/21  ? ?  ? ? ?Education: Resistance Exercise: ?- Group verbal and visual presentation on the components of exercise prescription. Introduces F.I.T.T principle from ACSM for exercise prescriptions  Reviews F.I.T.T. principles of resistance exercise including progression. Written material given at graduation. ? ?  ?Education: Exercise & Equipment Safety: ?- Individual verbal instruction and demonstration of equipment use and safety with use of the equipment. ?Flowsheet Row Cardiac Rehab from 05/02/2021 in [facility] Cardiac and Pulmonary Rehab  ?Education need identified 05/02/21  ?Date 05/02/21  ?Educator KL  ?Instruction Review Code 1- Verbalizes Understanding  ? ?  ? ? ?Education: Exercise Physiology & General Exercise Guidelines: ?- Group verbal and written instruction with models to review the exercise physiology of the cardiovascular system and associated critical values. Provides general exercise guidelines with specific guidelines to those with heart or lung disease.  ? ? ?Education: Flexibility, Balance, Mind/Body Relaxation: ?- Group verbal and visual presentation with interactive activity on the components  of exercise prescription. Introduces F.I.T.T principle from ACSM for exercise prescriptions. Reviews F.I.T.T. principles of flexibility and balance exercise training including progression. Also discusses the mind body connection.  Reviews various relaxation techniques to help reduce and manage stress (i.e. Deep  breathing, progressive muscle relaxation, and visualization). Balance handout provided to take home. Written material given at graduation. ? ? ?Activity Barriers & Risk Stratification: ? Activity Barriers & Cardiac Risk Stratification - 05/02/21 1054   ? ?  ? Activity Barriers & Cardiac Risk Stratification  ? Activity Barriers Muscular Weakness   ? Cardiac Risk Stratification Low   ? ?  ?  ? ?  ? ? ?6 Minute Walk: ? 6 Minute Walk   ? ? Christopher Huff Name 05/02/21 1053  ?  ?  ?  ? 6 Minute Walk  ? Phase Initial    ? Distance 1350 feet    ? Walk Time 6 minutes    ? # of Rest Breaks 0    ? MPH 2.55    ? METS 3.72    ? RPE 11    ? Perceived Dyspnea  0    ? VO2 Peak 13.04    ? Symptoms No    ? Resting HR 74 bpm    ? Resting BP 112/66    ? Resting Oxygen Saturation  98 %    ? Exercise Oxygen Saturation  during 6 min walk 97 %    ? Max Ex. HR 86 bpm    ? Max Ex. BP 124/62    ? 2 Minute Post BP 116/62    ? ?  ?  ? ?  ? ? ?Oxygen Initial Assessment: ? ? ?Oxygen Re-Evaluation: ? ? ?Oxygen Discharge (Final Oxygen Re-Evaluation): ? ? ?Initial Exercise Prescription: ? Initial Exercise Prescription - 05/02/21 1100   ? ?  ? Date of Initial Exercise RX and Referring Provider  ? Date 05/02/21   ? Referring Provider Serafina Royals MD   ?  ? Oxygen  ? Maintain Oxygen Saturation 88% or higher   ?  ? Treadmill  ? MPH 2.5   ? Grade 0.5   ? Minutes 15   ? METs 3.09   ?  ? Recumbant Bike  ? Level 2   ? RPM 60   ? Watts 30   ? Minutes 15   ? METs 3.7   ?  ? NuStep  ? Level 2   ? SPM 80   ? Minutes 15   ? METs 3.7   ?  ? REL-XR  ? Level 2   ? Speed 50   ? Minutes 15   ? METs 3.7   ?  ? Track  ? Laps 32   ? Minutes 15   ? METs 2.74   ?  ? Prescription Details  ? Frequency (times per week) 2   ? Duration Progress to 30 minutes of continuous aerobic without signs/symptoms of physical distress   ?  ? Intensity  ? THRR 40-80% of Max Heartrate 106 - 138   ? Ratings of Perceived Exertion 11-13   ? Perceived Dyspnea 0-4   ?  ? Progression  ? Progression  Continue to progress workloads to maintain intensity without signs/symptoms of physical distress.   ?  ? Resistance Training  ? Training Prescription Yes   ? Weight 4 lb   ? Reps 10-15   ? ?  ?  ? ?  ? ? ?Perform Capillary Blood Glucose checks as needed. ? ?  Exercise Prescription Changes: ? ? Exercise Prescription Changes   ? ? [place] Name 05/02/21 1100  ?  ?  ?  ?  ?  ? Response to Exercise  ? Blood Pressure (Admit) 112/66      ? Blood Pressure (Exercise) 124/62      ? Blood Pressure (Exit) 116/62      ? Heart Rate (Admit) 74 bpm      ? Heart Rate (Exercise) 86 bpm      ? Heart Rate (Exit) 73 bpm      ? Oxygen Saturation (Admit) 98 %      ? Oxygen Saturation (Exercise) 97 %      ? Rating of Perceived Exertion (Exercise) 11      ? Perceived Dyspnea (Exercise) 0      ? Symptoms none      ? Comments walk test results      ? ?  ?  ? ?  ? ? ?Exercise Comments: ? ? ?Exercise Goals and Review: ? ? Exercise Goals   ? ? [place] Name 05/02/21 1111  ?  ?  ?  ?  ?  ? Exercise Goals  ? Increase Physical Activity Yes      ? Intervention Provide advice, education, support and counseling about physical activity/exercise needs.;Develop an individualized exercise prescription for aerobic and resistive training based on initial evaluation findings, risk stratification, comorbidities and participant's personal goals.      ? Expected Outcomes Short Term: Attend rehab on a regular basis to increase amount of physical activity.;Long Term: Exercising regularly at least 3-5 days a week.;Long Term: Add in home exercise to make exercise part of routine and to increase amount of physical activity.      ? Increase Strength and Stamina Yes      ? Intervention Provide advice, education, support and counseling about physical activity/exercise needs.;Develop an individualized exercise prescription for aerobic and resistive training based on initial evaluation findings, risk stratification, comorbidities and participant's personal goals.      ? Expected  Outcomes Short Term: Increase workloads from initial exercise prescription for resistance, speed, and METs.;Short Term: Perform resistance training exercises routinely during rehab and add in resistance training at h

## 2021-05-02 NOTE — Patient Instructions (Signed)
Patient Instructions ? ?Patient Details  ?Name: Christopher Huff ?MRN: [id number] ?Date of Birth: [birth year] ?Referring Provider:  Corey Skains, MD ? ?Below are your personal goals for exercise, nutrition, and risk factors. Our goal is to help you stay on track towards obtaining and maintaining these goals. We will be discussing your progress on these goals with you throughout the program. ? ?Initial Exercise Prescription: ? Initial Exercise Prescription - 05/02/21 1100   ? ?  ? Date of Initial Exercise RX and Referring Provider  ? Date 05/02/21   ? Referring Provider Serafina Royals MD   ?  ? Oxygen  ? Maintain Oxygen Saturation 88% or higher   ?  ? Treadmill  ? MPH 2.5   ? Grade 0.5   ? Minutes 15   ? METs 3.09   ?  ? Recumbant Bike  ? Level 2   ? RPM 60   ? Watts 30   ? Minutes 15   ? METs 3.7   ?  ? NuStep  ? Level 2   ? SPM 80   ? Minutes 15   ? METs 3.7   ?  ? REL-XR  ? Level 2   ? Speed 50   ? Minutes 15   ? METs 3.7   ?  ? Track  ? Laps 32   ? Minutes 15   ? METs 2.74   ?  ? Prescription Details  ? Frequency (times per week) 2   ? Duration Progress to 30 minutes of continuous aerobic without signs/symptoms of physical distress   ?  ? Intensity  ? THRR 40-80% of Max Heartrate 106 - 138   ? Ratings of Perceived Exertion 11-13   ? Perceived Dyspnea 0-4   ?  ? Progression  ? Progression Continue to progress workloads to maintain intensity without signs/symptoms of physical distress.   ?  ? Resistance Training  ? Training Prescription Yes   ? Weight 4 lb   ? Reps 10-15   ? ?  ?  ? ?  ? ? ?Exercise Goals: ?Frequency: Be able to perform aerobic exercise two to three times per week in program working toward 2-5 days per week of home exercise. ? ?Intensity: Work with a perceived exertion of 11 (fairly light) - 15 (hard) while following your exercise prescription.  We will make changes to your prescription with you as you progress through the program. ?  ?Duration: Be able to do 30 to 45 minutes of continuous aerobic  exercise in addition to a 5 minute warm-up and a 5 minute cool-down routine. ?  ?Nutrition Goals: ?Your personal nutrition goals will be established when you do your nutrition analysis with the dietician. ? ?The following are general nutrition guidelines to follow: ?Cholesterol < '200mg'$ /day ?Sodium < '1500mg'$ /day ?Fiber: Men over 50 yrs - 30 grams per day ? ?Personal Goals: ? Personal Goals and Risk Factors at Admission - 05/02/21 1112   ? ?  ? Core Components/Risk Factors/Patient Goals on Admission  ?  Weight Management Yes;Weight Gain   ? Intervention Weight Management: Develop a combined nutrition and exercise program designed to reach desired caloric intake, while maintaining appropriate intake of nutrient and fiber, sodium and fats, and appropriate energy expenditure required for the weight goal.;Weight Management: Provide education and appropriate resources to help participant work on and attain dietary goals.;Weight Management/Obesity: Establish reasonable short term and long term weight goals.   ? Admit Weight 114 lb (51.7 kg)   ?  Goal Weight: Short Term 119 lb (54 kg)   ? Goal Weight: Long Term 140 lb (63.5 kg)   ? Expected Outcomes Short Term: Continue to assess and modify interventions until short term weight is achieved;Long Term: Adherence to nutrition and physical activity/exercise program aimed toward attainment of established weight goal;Weight Gain: Understanding of general recommendations for a high calorie, high protein meal plan that promotes weight gain by distributing calorie intake throughout the day with the consumption for 4-5 meals, snacks, and/or supplements;Understanding recommendations for meals to include 15-35% energy as protein, 25-35% energy from fat, 35-60% energy from carbohydrates, less than '200mg'$  of dietary cholesterol, 20-35 gm of total fiber daily;Understanding of distribution of calorie intake throughout the day with the consumption of 4-5 meals/snacks   ? Tobacco Cessation Yes    ? Number of packs per day Lennette Bihari is a current tobacco user. Intervention for tobacco cessation was provided at the initial medical review. He was asked about readiness to quit and reported he is slowly working on quitting. He is currently using snuff where 1 can lasts him about 5 days. Patient was advised and educated about tobacco cessation using combination therapy, tobacco cessation classes, quit line, and quit smoking apps. Patient demonstrated understanding of this material. Staff will continue to provide encouragement and follow up with the patient throughout the program.  He reports that he is using a can of snuff over 4-5 days   ? Intervention Assist the participant in steps to quit. Provide individualized education and counseling about committing to Tobacco Cessation, relapse prevention, and pharmacological support that can be provided by physician.;Advice worker, assist with locating and accessing local/national Quit Smoking programs, and support quit date choice.   ? Expected Outcomes Short Term: Will demonstrate readiness to quit, by selecting a quit date.;Short Term: Will quit all tobacco product use, adhering to prevention of relapse plan.;Long Term: Complete abstinence from all tobacco products for at least 12 months from quit date.   ? Diabetes Yes   ? Intervention Provide education about signs/symptoms and action to take for hypo/hyperglycemia.;Provide education about proper nutrition, including hydration, and aerobic/resistive exercise prescription along with prescribed medications to achieve blood glucose in normal ranges: Fasting glucose 65-99 mg/dL   ? Expected Outcomes Short Term: Participant verbalizes understanding of the signs/symptoms and immediate care of hyper/hypoglycemia, proper foot care and importance of medication, aerobic/resistive exercise and nutrition plan for blood glucose control.;Long Term: Attainment of HbA1C < 7%.   ? Hypertension Yes   ? Intervention Provide  education on lifestyle modifcations including regular physical activity/exercise, weight management, moderate sodium restriction and increased consumption of fresh fruit, vegetables, and low fat dairy, alcohol moderation, and smoking cessation.;Monitor prescription use compliance.   ? Expected Outcomes Short Term: Continued assessment and intervention until BP is < 140/106m HG in hypertensive participants. < 130/854mHG in hypertensive participants with diabetes, heart failure or chronic kidney disease.;Long Term: Maintenance of blood pressure at goal levels.   ? Lipids Yes   ? Intervention Provide education and support for participant on nutrition & aerobic/resistive exercise along with prescribed medications to achieve LDL '70mg'$ , HDL >'40mg'$ .   ? Expected Outcomes Short Term: Participant states understanding of desired cholesterol values and is compliant with medications prescribed. Participant is following exercise prescription and nutrition guidelines.;Long Term: Cholesterol controlled with medications as prescribed, with individualized exercise RX and with personalized nutrition plan. Value goals: LDL < '70mg'$ , HDL > 40 mg.   ? ?  ?  ? ?  ? ? ?  Tobacco Use Initial Evaluation: ?Social History  ? ?Tobacco Use  ?Smoking Status Never  ?Smokeless Tobacco Current  ? Types: Snuff  ?Tobacco Comments  ? Quit before surgery, surgery cancelled started back . Working on cessation   ? ? ?Exercise Goals and Review: ? Exercise Goals   ? ? [place] Name 05/02/21 1111  ?  ?  ?  ?  ?  ? Exercise Goals  ? Increase Physical Activity Yes      ? Intervention Provide advice, education, support and counseling about physical activity/exercise needs.;Develop an individualized exercise prescription for aerobic and resistive training based on initial evaluation findings, risk stratification, comorbidities and participant's personal goals.      ? Expected Outcomes Short Term: Attend rehab on a regular basis to increase amount of physical  activity.;Long Term: Exercising regularly at least 3-5 days a week.;Long Term: Add in home exercise to make exercise part of routine and to increase amount of physical activity.      ? Increase Strength and Stam

## 2021-05-04 DIAGNOSIS — Z952 Presence of prosthetic heart valve: Secondary | ICD-10-CM | POA: Diagnosis not present

## 2021-05-04 LAB — GLUCOSE, CAPILLARY
Glucose-Capillary: 164 mg/dL — ABNORMAL HIGH (ref 70–99)
Glucose-Capillary: 166 mg/dL — ABNORMAL HIGH (ref 70–99)

## 2021-05-04 NOTE — Progress Notes (Signed)
Daily Session Note ? ?Patient Details  ?Name: Mylin K Hudspeth ?MRN: [id number] ?Date of Birth: 09/10/1955 ?Referring Provider:   ?Flowsheet Row Cardiac Rehab from 05/02/2021 in [facility] Re[facility]  ?Referring Provider Kowalski, Bruce MD  ? ?  ? ? ?Encounter Date: 05/04/2021 ? ?Check In: ? Session Check In - 05/04/21 0717   ? ?  ? Check-In  ? Supervising physician immediately available to respond to emergencies See telemetry face sheet for immediately available ER MD   ? Location [facility] & [facility]   ? Staff Present Kelly Bollinger, MPA, RN;Jessica Hawkins, MA, RCEP, CCRP, CCET;Joseph Hood, RCP,RRT,BSRT   ? Virtual Visit No   ? Medication changes reported     No   ? Fall or balance concerns reported    No   ? Tobacco Cessation No Change   ? Warm-up and Cool-down Performed on first and last piece of equipment   ? Resistance Training Performed Yes   ? VAD Patient? No   ? PAD/SET Patient? No   ?  ? Pain Assessment  ? Currently in Pain? No/denies   ? ?  ?  ? ?  ? ? ? ? ? ?Social History  ? ?Tobacco Use  ?Smoking Status Never  ?Smokeless Tobacco Current  ? Types: Snuff  ?Tobacco Comments  ? Quit before surgery, surgery cancelled started back . Working on cessation   ? ? ?Goals Met:  ?Independence with exercise equipment ?Exercise tolerated well ?No report of concerns or symptoms today ?Strength training completed today ? ?Goals Unmet:  ?Not Applicable ? ?Comments: First full day of exercise!  Patient was oriented to gym and equipment including functions, settings, policies, and procedures.  Patient's individual exercise prescription and treatment plan were reviewed.  All starting workloads were established based on the results of the 6 minute walk test done at initial orientation visit.  The plan for exercise progression was also introduced and progression will be customized based on patient's performance and goals. ? ? ? ?Dr. Mark Miller is Medical Director for [facility].  ?Dr.  Fuad Aleskerov is Medical Director for [facility]. ?

## 2021-05-09 DIAGNOSIS — Z952 Presence of prosthetic heart valve: Secondary | ICD-10-CM

## 2021-05-09 LAB — GLUCOSE, CAPILLARY
Glucose-Capillary: 181 mg/dL — ABNORMAL HIGH (ref 70–99)
Glucose-Capillary: 206 mg/dL — ABNORMAL HIGH (ref 70–99)

## 2021-05-09 NOTE — Progress Notes (Signed)
Daily Session Note ? ?Patient Details  ?Name: Christopher Huff ?MRN: [id number] ?Date of Birth: Jun 10, 1955 ?Referring Provider:   ?Flowsheet Row Cardiac Rehab from 05/02/2021 in [facility] Cardiac and Pulmonary Rehab  ?Referring Provider Serafina Royals MD  ? ?  ? ? ?Encounter Date: 05/09/2021 ? ?Check In: ? Session Check In - 05/09/21 0718   ? ?  ? Check-In  ? Supervising physician immediately available to respond to emergencies See telemetry face sheet for immediately available ER MD   ? Location [facility]   ? Staff Present Birdie Sons, MPA, RN;Jessica Burns, MA, RCEP, CCRP, [place], BS, ACSM CEP, Exercise Physiologist   ? Virtual Visit No   ? Medication changes reported     No   ? Fall or balance concerns reported    No   ? Tobacco Cessation No Change   ? Warm-up and Cool-down Performed on first and last piece of equipment   ? Resistance Training Performed Yes   ? VAD Patient? No   ? PAD/SET Patient? No   ?  ? Pain Assessment  ? Currently in Pain? No/denies   ? ?  ?  ? ?  ? ? ? ? ? ?Social History  ? ?Tobacco Use  ?Smoking Status Never  ?Smokeless Tobacco Current  ? Types: Snuff  ?Tobacco Comments  ? Quit before surgery, surgery cancelled started back . Working on cessation   ? ? ?Goals Met:  ?Independence with exercise equipment ?Exercise tolerated well ?No report of concerns or symptoms today ?Strength training completed today ? ?Goals Unmet:  ?Not Applicable ? ?Comments: Pt able to follow exercise prescription today without complaint.  Will continue to monitor for progression. ? ? ? ?Dr. Emily Filbert is Medical Director for [place].  ?Dr. Ottie Glazier is Medical Director for [facility] Rehabilitation. ?

## 2021-05-09 NOTE — Progress Notes (Signed)
Completed initial RD consultation ?

## 2021-05-11 ENCOUNTER — Encounter: Payer: Medicare HMO | Admitting: *Deleted

## 2021-05-11 DIAGNOSIS — Z952 Presence of prosthetic heart valve: Secondary | ICD-10-CM

## 2021-05-11 LAB — GLUCOSE, CAPILLARY
Glucose-Capillary: 234 mg/dL — ABNORMAL HIGH (ref 70–99)
Glucose-Capillary: 207 mg/dL — ABNORMAL HIGH (ref 70–99)

## 2021-05-11 NOTE — Progress Notes (Signed)
Daily Session Note ? ?Patient Details  ?Name: BOWDEN BOODY ?MRN: [id number] ?Date of Birth: 1955/04/24 ?Referring Provider:   ?Flowsheet Row Cardiac Rehab from 05/02/2021 in [facility] Cardiac and Pulmonary Rehab  ?Referring Provider Serafina Royals MD  ? ?  ? ? ?Encounter Date: 05/11/2021 ? ?Check In: ? Session Check In - 05/11/21 0834   ? ?  ? Check-In  ? Supervising physician immediately available to respond to emergencies See telemetry face sheet for immediately available ER MD   ? Location [facility]   ? Staff Present Heath Lark, RN, BSN, CCRP;Melissa Bagley, RDN, LDN;Joseph [place], [place]   ? Virtual Visit No   ? Medication changes reported     No   ? Fall or balance concerns reported    No   ? Warm-up and Cool-down Performed on first and last piece of equipment   ? Resistance Training Performed Yes   ? VAD Patient? No   ? PAD/SET Patient? No   ?  ? Pain Assessment  ? Currently in Pain? No/denies   ? ?  ?  ? ?  ? ? ? ? ? ?Social History  ? ?Tobacco Use  ?Smoking Status Never  ?Smokeless Tobacco Current  ? Types: Snuff  ?Tobacco Comments  ? Quit before surgery, surgery cancelled started back . Working on cessation   ? ? ?Goals Met:  ?Independence with exercise equipment ?Exercise tolerated well ?No report of concerns or symptoms today ? ?Goals Unmet:  ?Not Applicable ? ?Comments: Pt able to follow exercise prescription today without complaint.  Will continue to monitor for progression. ? ? ? ?Dr. Emily Filbert is Medical Director for [facility].  ?Dr. Ottie Glazier is Medical Director for [facility] Pulmonary Rehabilitation. ?

## 2021-05-16 DIAGNOSIS — Z952 Presence of prosthetic heart valve: Secondary | ICD-10-CM

## 2021-05-16 NOTE — Progress Notes (Signed)
Daily Session Note ? ?Patient Details  ?Name: Christopher Huff ?MRN: [id number] ?Date of Birth: April 17, 1955 ?Referring Provider:   ?Flowsheet Row Cardiac Rehab from 05/02/2021 in [facility] Re[facility]  ?Referring Provider Serafina Royals MD  ? ?  ? ? ?Encounter Date: 05/16/2021 ? ?Check In: ? Session Check In - 05/16/21 0711   ? ?  ? Check-In  ? Supervising physician immediately available to respond to emergencies See telemetry face sheet for immediately available ER MD   ? Location [facility]   ? Staff Present Birdie Sons, MPA, RN;Jessica [place], MA, RCEP, CCRP, [place], BS, ACSM CEP, Exercise Physiologist   ? Virtual Visit No   ? Medication changes reported     No   ? Fall or balance concerns reported    No   ? Tobacco Cessation No Change   ? Warm-up and Cool-down Performed on first and last piece of equipment   ? Resistance Training Performed Yes   ? VAD Patient? No   ? PAD/SET Patient? No   ?  ? Pain Assessment  ? Currently in Pain? No/denies   ? ?  ?  ? ?  ? ? ? ? ? ?Social History  ? ?Tobacco Use  ?Smoking Status Never  ?Smokeless Tobacco Current  ? Types: Snuff  ?Tobacco Comments  ? Quit before surgery, surgery cancelled started back . Working on cessation   ? ? ?Goals Met:  ?Independence with exercise equipment ?Exercise tolerated well ?No report of concerns or symptoms today ?Strength training completed today ? ?Goals Unmet:  ?Not Applicable ? ?Comments: Pt able to follow exercise prescription today without complaint.  Will continue to monitor for progression. ? ?Reviewed home exercise with pt today.  Pt plans to walk and use treadmill at home for exercise.  Reviewed THR, pulse, RPE, sign and symptoms, pulse oximetery and when to call 911 or MD.  Also discussed weather considerations and indoor options.  Pt voiced understanding. ? ? ? ?Dr. Emily Filbert is Medical Director for [place].  ?Dr. Ottie Glazier is Medical Director for [facility] Pulmonary  Rehabilitation. ?

## 2021-05-17 ENCOUNTER — Encounter: Payer: Self-pay | Admitting: *Deleted

## 2021-05-17 DIAGNOSIS — Z952 Presence of prosthetic heart valve: Secondary | ICD-10-CM

## 2021-05-17 NOTE — Progress Notes (Signed)
Cardiac Individual Treatment Plan ? ?Patient Details  ?Name: KAISEN ACKERS ?MRN: [id number] ?Date of Birth: 1955-10-08 ?Referring Provider:   ?Flowsheet Row Cardiac Rehab from 05/02/2021 in [facility] Cardiac and Pulmonary Rehab  ?Referring Provider Serafina Royals MD  ? ?  ? ? ?Initial Encounter Date:  ?Flowsheet Row Cardiac Rehab from 05/02/2021 in [facility] Cardiac and Pulmonary Rehab  ?Date 05/02/21  ? ?  ? ? ?Visit Diagnosis: S/P TAVR (transcatheter aortic valve replacement) ? ?Patient's Home Medications on Admission: ? ?Current Outpatient Medications:  ?  aspirin 81 MG EC tablet, Take by mouth., Disp: , Rfl:  ?  empagliflozin (JARDIANCE) 25 MG TABS tablet, Take 1 tablet by mouth daily., Disp: , Rfl:  ?  glimepiride (AMARYL) 1 MG tablet, Take 2 mg by mouth daily., Disp: , Rfl:  ?  metFORMIN (GLUCOPHAGE) 500 MG tablet, Take 1 tablet (500 mg total) by mouth 2 (two) times daily with a meal., Disp: 60 tablet, Rfl: 11 ?  omeprazole (PRILOSEC) 20 MG capsule, Take 20 mg by mouth daily., Disp: , Rfl:  ?  vitamin B-12 (CYANOCOBALAMIN) 1000 MCG tablet, Take 1,000 mcg by mouth daily., Disp: , Rfl:  ? ?Past Medical History: ?Past Medical History:  ?Diagnosis Date  ? Alcohol dependence ([place])   ? Diabetes mellitus without complication ([place])   ? Hypertension   ? Weight loss   ? ? ?Tobacco Use: ?Social History  ? ?Tobacco Use  ?Smoking Status Never  ?Smokeless Tobacco Current  ? Types: Snuff  ?Tobacco Comments  ? Quit before surgery, surgery cancelled started back . Working on cessation   ? ? ?Labs: ?Review Flowsheet   ? ?    ? View : No data to display.  ?  ?  ?  ?  ?  ? ? ? ?Exercise Target Goals: ?Exercise Program Goal: ?Individual exercise prescription set using results from initial 6 min walk test and THRR while considering  patient?s activity barriers and safety.  ? ?Exercise Prescription Goal: ?Initial exercise prescription builds to 30-45 minutes a day of aerobic activity, 2-3 days per week.  Home exercise guidelines will be given to  patient during program as part of exercise prescription that the participant will acknowledge. ? ? ?Education: Aerobic Exercise: ?- Group verbal and visual presentation on the components of exercise prescription. Introduces F.I.T.T principle from ACSM for exercise prescriptions.  Reviews F.I.T.T. principles of aerobic exercise including progression. Written material given at graduation. ?Flowsheet Row Cardiac Rehab from 05/04/2021 in [facility] Cardiac and Pulmonary Rehab  ?Education need identified 05/02/21  ?Date 05/04/21  ?Educator [facility]  ?Instruction Review Code 1- Verbalizes Understanding  ? ?  ? ? ?Education: Resistance Exercise: ?- Group verbal and visual presentation on the components of exercise prescription. Introduces F.I.T.T principle from ACSM for exercise prescriptions  Reviews F.I.T.T. principles of resistance exercise including progression. Written material given at graduation. ? ?  ?Education: Exercise & Equipment Safety: ?- Individual verbal instruction and demonstration of equipment use and safety with use of the equipment. ?Flowsheet Row Cardiac Rehab from 05/04/2021 in [facility] Cardiac and Pulmonary Rehab  ?Education need identified 05/02/21  ?Date 05/02/21  ?Educator [place]  ?Instruction Review Code 1- Verbalizes Understanding  ? ?  ? ? ?Education: Exercise Physiology & General Exercise Guidelines: ?- Group verbal and written instruction with models to review the exercise physiology of the cardiovascular system and associated critical values. Provides general exercise guidelines with specific guidelines to those with heart or lung disease.  ? ? ?Education: Flexibility, Balance, Mind/Body  Relaxation: ?- Group verbal and visual presentation with interactive activity on the components of exercise prescription. Introduces F.I.T.T principle from ACSM for exercise prescriptions. Reviews F.I.T.T. principles of flexibility and balance exercise training including progression. Also discusses the mind body connection.   Reviews various relaxation techniques to help reduce and manage stress (i.e. Deep breathing, progressive muscle relaxation, and visualization). Balance handout provided to take home. Written material given at graduation. ? ? ?Activity Barriers & Risk Stratification: ? Activity Barriers & Cardiac Risk Stratification - 05/02/21 1054   ? ?  ? Activity Barriers & Cardiac Risk Stratification  ? Activity Barriers Muscular Weakness   ? Cardiac Risk Stratification Low   ? ?  ?  ? ?  ? ? ?6 Minute Walk: ? 6 Minute Walk   ? ? [place] Name 05/02/21 1053  ?  ?  ?  ? 6 Minute Walk  ? Phase Initial    ? Distance 1350 feet    ? Walk Time 6 minutes    ? # of Rest Breaks 0    ? MPH 2.55    ? METS 3.72    ? RPE 11    ? Perceived Dyspnea  0    ? VO2 Peak 13.04    ? Symptoms No    ? Resting HR 74 bpm    ? Resting BP 112/66    ? Resting Oxygen Saturation  98 %    ? Exercise Oxygen Saturation  during 6 min walk 97 %    ? Max Ex. HR 86 bpm    ? Max Ex. BP 124/62    ? 2 Minute Post BP 116/62    ? ?  ?  ? ?  ? ? ?Oxygen Initial Assessment: ? ? ?Oxygen Re-Evaluation: ? ? ?Oxygen Discharge (Final Oxygen Re-Evaluation): ? ? ?Initial Exercise Prescription: ? Initial Exercise Prescription - 05/02/21 1100   ? ?  ? Date of Initial Exercise RX and Referring Provider  ? Date 05/02/21   ? Referring Provider Serafina Royals MD   ?  ? Oxygen  ? Maintain Oxygen Saturation 88% or higher   ?  ? Treadmill  ? MPH 2.5   ? Grade 0.5   ? Minutes 15   ? METs 3.09   ?  ? Recumbant Bike  ? Level 2   ? RPM 60   ? Watts 30   ? Minutes 15   ? METs 3.7   ?  ? NuStep  ? Level 2   ? SPM 80   ? Minutes 15   ? METs 3.7   ?  ? REL-XR  ? Level 2   ? Speed 50   ? Minutes 15   ? METs 3.7   ?  ? Track  ? Laps 32   ? Minutes 15   ? METs 2.74   ?  ? Prescription Details  ? Frequency (times per week) 2   ? Duration Progress to 30 minutes of continuous aerobic without signs/symptoms of physical distress   ?  ? Intensity  ? THRR 40-80% of Max Heartrate 106 - 138   ? Ratings of  Perceived Exertion 11-13   ? Perceived Dyspnea 0-4   ?  ? Progression  ? Progression Continue to progress workloads to maintain intensity without signs/symptoms of physical distress.   ?  ? Resistance Training  ? Training Prescription Yes   ? Weight 4 lb   ? Reps 10-15   ? ?  ?  ? ?  ? ? ?  Perform Capillary Blood Glucose checks as needed. ? ?Exercise Prescription Changes: ? ? Exercise Prescription Changes   ? ? [place] Name 05/02/21 1100 05/08/21 1500  ?  ?  ?  ?  ? Response to Exercise  ? Blood Pressure (Admit) 112/66 110/60     ? Blood Pressure (Exercise) 124/62 142/70     ? Blood Pressure (Exit) 116/62 124/64     ? Heart Rate (Admit) 74 bpm 69 bpm     ? Heart Rate (Exercise) 86 bpm 96 bpm     ? Heart Rate (Exit) 73 bpm 77 bpm     ? Oxygen Saturation (Admit) 98 % --     ? Oxygen Saturation (Exercise) 97 % --     ? Rating of Perceived Exertion (Exercise) 11 13     ? Perceived Dyspnea (Exercise) 0 --     ? Symptoms none none     ? Comments walk test results first full day of exercise     ? Duration -- Continue with 30 min of aerobic exercise without signs/symptoms of physical distress.     ? Intensity -- THRR unchanged     ?  ? Progression  ? Progression -- Continue to progress workloads to maintain intensity without signs/symptoms of physical distress.     ? Average METs -- 2.55     ?  ? Resistance Training  ? Training Prescription -- Yes     ? Weight -- 4 lb     ? Reps -- 10-15     ?  ? Interval Training  ? Interval Training -- No     ?  ? NuStep  ? Level -- 2     ? Minutes -- 15     ? METs -- 2.2     ?  ? Track  ? Laps -- 35     ? Minutes -- 15     ? METs -- 2.9     ?  ? Oxygen  ? Maintain Oxygen Saturation -- 88% or higher     ? ?  ?  ? ?  ? ? ?Exercise Comments: ? ? Exercise Comments   ? ? [place] Name 05/04/21 2409  ?  ?  ?  ?  ? Exercise Comments First full day of exercise!  Patient was oriented to gym and equipment including functions, settings, policies, and procedures.  Patient's individual exercise prescription  and treatment plan were reviewed.  All starting workloads were established based on the results of the 6 minute walk test done at initial orientation visit.  The plan for exercise progression was also introduced and p

## 2021-05-18 DIAGNOSIS — Z952 Presence of prosthetic heart valve: Secondary | ICD-10-CM

## 2021-05-18 NOTE — Progress Notes (Signed)
Daily Session Note  Patient Details  Name: Christopher Huff MRN: [id number] Date of Birth: Oct 07, 1955 Referring Provider:   Flowsheet Row Cardiac Rehab from 05/02/2021 in [facility] Cardiac and Pulmonary Rehab  Referring Provider Serafina Royals MD       Encounter Date: 05/18/2021  Check In:  Session Check In - 05/18/21 0725       Check-In   Supervising physician immediately available to respond to emergencies See telemetry face sheet for immediately available ER MD    Location [facility]-Cardiac & Pulmonary Rehab    Staff Present Birdie Sons, MPA, RN;Melissa Pine Knot, RDN, LDN;Jessica Hawkins, MA, RCEP, CCRP, CCET    Virtual Visit No    Medication changes reported     No    Fall or balance concerns reported    No    Tobacco Cessation No Change    Warm-up and Cool-down Performed on first and last piece of equipment    Resistance Training Performed Yes    VAD Patient? No    PAD/SET Patient? No      Pain Assessment   Currently in Pain? No/denies                Social History   Tobacco Use  Smoking Status Never  Smokeless Tobacco Current   Types: Snuff  Tobacco Comments   Quit before surgery, surgery cancelled started back . Working on cessation     Goals Met:  Independence with exercise equipment Exercise tolerated well No report of concerns or symptoms today Strength training completed today  Goals Unmet:  Not Applicable  Comments: Pt able to follow exercise prescription today without complaint.  Will continue to monitor for progression.    Dr. Emily Filbert is Medical Director for [place].  Dr. Ottie Glazier is Medical Director for [facility] Pulmonary Rehabilitation.

## 2021-05-23 DIAGNOSIS — Z952 Presence of prosthetic heart valve: Secondary | ICD-10-CM

## 2021-05-23 NOTE — Progress Notes (Signed)
Daily Session Note  Patient Details  Name: Christopher Huff MRN: [id number] Date of Birth: Jan 24, 1955 Referring Provider:   Flowsheet Row Cardiac Rehab from 05/02/2021 in [facility] Cardiac and Pulmonary Rehab  Referring Provider Serafina Royals MD       Encounter Date: 05/23/2021  Check In:  Session Check In - 05/23/21 0713       Check-In   Supervising physician immediately available to respond to emergencies See telemetry face sheet for immediately available ER MD    Location [facility]-Cardiac & Pulmonary Rehab    Staff Present Birdie Sons, MPA, RN;Jessica [place], MA, RCEP, CCRP, [place], BS, ACSM CEP, Exercise Physiologist    Virtual Visit No    Medication changes reported     No    Fall or balance concerns reported    No    Tobacco Cessation No Change    Warm-up and Cool-down Performed on first and last piece of equipment    Resistance Training Performed Yes    VAD Patient? No    PAD/SET Patient? No      Pain Assessment   Currently in Pain? No/denies                Social History   Tobacco Use  Smoking Status Never  Smokeless Tobacco Current   Types: Snuff  Tobacco Comments   Quit before surgery, surgery cancelled started back . Working on cessation     Goals Met:  Independence with exercise equipment Exercise tolerated well Personal goals reviewed No report of concerns or symptoms today Strength training completed today  Goals Unmet:  Not Applicable  Comments: Pt able to follow exercise prescription today without complaint.  Will continue to monitor for progression.    Dr. Emily Filbert is Medical Director for [place].  Dr. Ottie Glazier is Medical Director for [facility] Pulmonary Rehabilitation.

## 2021-05-24 DIAGNOSIS — Z952 Presence of prosthetic heart valve: Secondary | ICD-10-CM | POA: Diagnosis not present

## 2021-05-24 NOTE — Progress Notes (Signed)
Daily Session Note  Patient Details  Name: Christopher Huff MRN: [id number] Date of Birth: 19-Jul-1955 Referring Provider:   Flowsheet Row Cardiac Rehab from 05/02/2021 in [facility] Cardiac and Pulmonary Rehab  Referring Provider Serafina Royals MD       Encounter Date: 05/24/2021  Check In:  Session Check In - 05/24/21 0711       Check-In   Supervising physician immediately available to respond to emergencies See telemetry face sheet for immediately available ER MD    Location [facility]-Cardiac & Pulmonary Rehab    Staff Present Birdie Sons, MPA, RN;Jessica Hartman, MA, RCEP, CCRP, CCET;Joseph [place], [place]    Virtual Visit No    Medication changes reported     No    Fall or balance concerns reported    No    Tobacco Cessation No Change    Warm-up and Cool-down Performed on first and last piece of equipment    Resistance Training Performed Yes    VAD Patient? No    PAD/SET Patient? No      Pain Assessment   Currently in Pain? No/denies                Social History   Tobacco Use  Smoking Status Never  Smokeless Tobacco Current   Types: Snuff  Tobacco Comments   Quit before surgery, surgery cancelled started back . Working on cessation     Goals Met:  Independence with exercise equipment Exercise tolerated well No report of concerns or symptoms today Strength training completed today  Goals Unmet:  Not Applicable  Comments: Pt able to follow exercise prescription today without complaint.  Will continue to monitor for progression.    Dr. Emily Filbert is Medical Director for [place].  Dr. Ottie Glazier is Medical Director for [facility] Pulmonary Rehabilitation.

## 2021-06-06 ENCOUNTER — Encounter: Payer: Medicare HMO | Attending: Internal Medicine

## 2021-06-06 DIAGNOSIS — Z952 Presence of prosthetic heart valve: Secondary | ICD-10-CM | POA: Diagnosis present

## 2021-06-06 NOTE — Progress Notes (Signed)
Daily Session Note  Patient Details  Name: Christopher Huff MRN: [id number] Date of Birth: 1955/08/18 Referring Provider:   Flowsheet Row Cardiac Rehab from 05/02/2021 in [facility] Cardiac and Pulmonary Rehab  Referring Provider Serafina Royals MD       Encounter Date: 06/06/2021  Check In:  Session Check In - 06/06/21 0710       Check-In   Supervising physician immediately available to respond to emergencies See telemetry face sheet for immediately available ER MD    Location [facility]-Cardiac & Pulmonary Rehab    Staff Present Earlean Shawl, BS, ACSM CEP, Exercise Physiologist;Jessica Luan Pulling, MA, RCEP, CCRP, Kathaleen Maser, MPA, RN    Virtual Visit No    Medication changes reported     No    Fall or balance concerns reported    No    Tobacco Cessation No Change    Warm-up and Cool-down Performed on first and last piece of equipment    Resistance Training Performed Yes    VAD Patient? No    PAD/SET Patient? No      Pain Assessment   Currently in Pain? No/denies                Social History   Tobacco Use  Smoking Status Never  Smokeless Tobacco Current   Types: Snuff  Tobacco Comments   Quit before surgery, surgery cancelled started back . Working on cessation     Goals Met:  Independence with exercise equipment Exercise tolerated well No report of concerns or symptoms today Strength training completed today  Goals Unmet:  Not Applicable  Comments: Pt able to follow exercise prescription today without complaint.  Will continue to monitor for progression.    Dr. Emily Filbert is Medical Director for [place].  Dr. Ottie Glazier is Medical Director for [facility] Pulmonary Rehabilitation.

## 2021-06-08 VITALS — Ht 69.3 in | Wt 112.8 lb

## 2021-06-08 DIAGNOSIS — Z952 Presence of prosthetic heart valve: Secondary | ICD-10-CM

## 2021-06-08 NOTE — Patient Instructions (Signed)
Discharge Patient Instructions  Patient Details  Name: Christopher Huff MRN: [id number] Date of Birth: 1955-11-14 Referring Provider:  Leonel Ramsay, MD   Number of Visits: 11  Reason for Discharge:  Patient reached a stable level of exercise. Patient independent in their exercise. Patient has met program and personal goals.  Smoking History:  Social History   Tobacco Use  Smoking Status Never  Smokeless Tobacco Current   Types: Snuff  Tobacco Comments   Quit before surgery, surgery cancelled started back . Working on cessation     Diagnosis:  S/P TAVR (transcatheter aortic valve replacement)  Initial Exercise Prescription:  Initial Exercise Prescription - 05/02/21 1100       Date of Initial Exercise RX and Referring Provider   Date 05/02/21    Referring Provider Serafina Royals MD      Oxygen   Maintain Oxygen Saturation 88% or higher      Treadmill   MPH 2.5    Grade 0.5    Minutes 15    METs 3.09      Recumbant Bike   Level 2    RPM 60    Watts 30    Minutes 15    METs 3.7      NuStep   Level 2    SPM 80    Minutes 15    METs 3.7      REL-XR   Level 2    Speed 50    Minutes 15    METs 3.7      Track   Laps 32    Minutes 15    METs 2.74      Prescription Details   Frequency (times per week) 2    Duration Progress to 30 minutes of continuous aerobic without signs/symptoms of physical distress      Intensity   THRR 40-80% of Max Heartrate 106 - 138    Ratings of Perceived Exertion 11-13    Perceived Dyspnea 0-4      Progression   Progression Continue to progress workloads to maintain intensity without signs/symptoms of physical distress.      Resistance Training   Training Prescription Yes    Weight 4 lb    Reps 10-15             Discharge Exercise Prescription (Final Exercise Prescription Changes):  Exercise Prescription Changes - 06/05/21 1500       Response to Exercise   Blood Pressure (Admit) 98/56    Blood  Pressure (Exercise) 124/72    Blood Pressure (Exit) 102/58    Heart Rate (Admit) 55 bpm    Heart Rate (Exercise) 11 bpm    Heart Rate (Exit) 86 bpm    Rating of Perceived Exertion (Exercise) 13    Symptoms none    Duration Continue with 30 min of aerobic exercise without signs/symptoms of physical distress.    Intensity THRR unchanged      Progression   Progression Continue to progress workloads to maintain intensity without signs/symptoms of physical distress.    Average METs 3.22      Resistance Training   Training Prescription Yes    Weight 6 lb    Reps 10-15      Interval Training   Interval Training No      Treadmill   MPH 3.3    Grade 2    Minutes 15    METs 4.44      Recumbant Elliptical   Level 1.1  Minutes 15    METs 2      REL-XR   Level 3    Minutes 15      Oxygen   Maintain Oxygen Saturation 88% or higher             Functional Capacity:  6 Minute Walk     Row Name 05/02/21 1053 06/08/21 0731       6 Minute Walk   Phase Initial Discharge    Distance 1350 feet 1510 feet    Distance % Change -- 11.85 %    Distance Feet Change -- 160 ft    Walk Time 6 minutes 6 minutes    # of Rest Breaks 0 0    MPH 2.55 2.85    METS 3.72 3.94    RPE 11 13    Perceived Dyspnea  0 1    VO2 Peak 13.04 13.79    Symptoms No No    Resting HR 74 bpm 66 bpm    Resting BP 112/66 112/62    Resting Oxygen Saturation  98 % 98 %    Exercise Oxygen Saturation  during 6 min walk 97 % 98 %    Max Ex. HR 86 bpm 81 bpm    Max Ex. BP 124/62 118/64    2 Minute Post BP 116/62 --               Nutrition & Weight - Outcomes:  Pre Biometrics - 05/02/21 1054       Pre Biometrics   Height 5' 9.3" (1.76 m)    Weight 114 lb 12.8 oz (52.1 kg)    BMI (Calculated) 16.81    Single Leg Stand 14.3 seconds             Post Biometrics - 06/08/21 0732        Post  Biometrics   Height 5' 9.3" (1.76 m)    Weight 112 lb 12.8 oz (51.2 kg)    BMI (Calculated)  16.[place]

## 2021-06-08 NOTE — Progress Notes (Signed)
Cardiac Individual Treatment Plan  Patient Details  Name: Christopher Huff MRN: [id number] Date of Birth: 01/27/1955 Referring Provider:   Flowsheet Row Cardiac Rehab from 05/02/2021 in [facility] Cardiac and Pulmonary Rehab  Referring Provider Serafina Royals MD       Initial Encounter Date:  Flowsheet Row Cardiac Rehab from 05/02/2021 in [facility] Cardiac and Pulmonary Rehab  Date 05/02/21       Visit Diagnosis: S/P TAVR (transcatheter aortic valve replacement)  Patient's Home Medications on Admission:  Current Outpatient Medications:    aspirin 81 MG EC tablet, Take by mouth., Disp: , Rfl:    empagliflozin (JARDIANCE) 25 MG TABS tablet, Take 1 tablet by mouth daily., Disp: , Rfl:    glimepiride (AMARYL) 1 MG tablet, Take 2 mg by mouth daily., Disp: , Rfl:    metFORMIN (GLUCOPHAGE) 500 MG tablet, Take 1 tablet (500 mg total) by mouth 2 (two) times daily with a meal., Disp: 60 tablet, Rfl: 11   omeprazole (PRILOSEC) 20 MG capsule, Take 20 mg by mouth daily., Disp: , Rfl:    vitamin B-12 (CYANOCOBALAMIN) 1000 MCG tablet, Take 1,000 mcg by mouth daily., Disp: , Rfl:   Past Medical History: Past Medical History:  Diagnosis Date   Alcohol dependence ([place])    Diabetes mellitus without complication ([place])    Hypertension    Weight loss     Tobacco Use: Social History   Tobacco Use  Smoking Status Never  Smokeless Tobacco Current   Types: Snuff  Tobacco Comments   Quit before surgery, surgery cancelled started back . Working on cessation     Labs: Review Flowsheet        No data to display           Exercise Target Goals: Exercise Program Goal: Individual exercise prescription set using results from initial 6 min walk test and THRR while considering  patient's activity barriers and safety.   Exercise Prescription Goal: Initial exercise prescription builds to 30-45 minutes a day of aerobic activity, 2-3 days per week.  Home exercise guidelines will be given to patient during  program as part of exercise prescription that the participant will acknowledge.   Education: Aerobic Exercise: - Group verbal and visual presentation on the components of exercise prescription. Introduces F.I.T.T principle from ACSM for exercise prescriptions.  Reviews F.I.T.T. principles of aerobic exercise including progression. Written material given at graduation. Flowsheet Row Cardiac Rehab from 05/04/2021 in [facility] Cardiac and Pulmonary Rehab  Education need identified 05/02/21  Date 05/04/21  Educator [facility]  Instruction Review Code 1- Verbalizes Understanding       Education: Resistance Exercise: - Group verbal and visual presentation on the components of exercise prescription. Introduces F.I.T.T principle from ACSM for exercise prescriptions  Reviews F.I.T.T. principles of resistance exercise including progression. Written material given at graduation.    Education: Exercise & Equipment Safety: - Individual verbal instruction and demonstration of equipment use and safety with use of the equipment. Flowsheet Row Cardiac Rehab from 05/04/2021 in [facility] Cardiac and Pulmonary Rehab  Education need identified 05/02/21  Date 05/02/21  Educator [place]  Instruction Review Code 1- Verbalizes Understanding       Education: Exercise Physiology & General Exercise Guidelines: - Group verbal and written instruction with models to review the exercise physiology of the cardiovascular system and associated critical values. Provides general exercise guidelines with specific guidelines to those with heart or lung disease.    Education: Flexibility, Balance, Mind/Body Relaxation: - Group verbal and visual presentation  with interactive activity on the components of exercise prescription. Introduces F.I.T.T principle from ACSM for exercise prescriptions. Reviews F.I.T.T. principles of flexibility and balance exercise training including progression. Also discusses the mind body connection.  Reviews various  relaxation techniques to help reduce and manage stress (i.e. Deep breathing, progressive muscle relaxation, and visualization). Balance handout provided to take home. Written material given at graduation.   Activity Barriers & Risk Stratification:  Activity Barriers & Cardiac Risk Stratification - 05/02/21 1054       Activity Barriers & Cardiac Risk Stratification   Activity Barriers Muscular Weakness    Cardiac Risk Stratification Low             6 Minute Walk:  6 Minute Walk     Row Name 05/02/21 1053 06/08/21 0731       6 Minute Walk   Phase Initial Discharge    Distance 1350 feet 1510 feet    Distance % Change -- 11.85 %    Distance Feet Change -- 160 ft    Walk Time 6 minutes 6 minutes    # of Rest Breaks 0 0    MPH 2.55 2.85    METS 3.72 3.94    RPE 11 13    Perceived Dyspnea  0 1    VO2 Peak 13.04 13.79    Symptoms No No    Resting HR 74 bpm 66 bpm    Resting BP 112/66 112/62    Resting Oxygen Saturation  98 % 98 %    Exercise Oxygen Saturation  during 6 min walk 97 % 98 %    Max Ex. HR 86 bpm 81 bpm    Max Ex. BP 124/62 118/64    2 Minute Post BP 116/62 --             Oxygen Initial Assessment:   Oxygen Re-Evaluation:   Oxygen Discharge (Final Oxygen Re-Evaluation):   Initial Exercise Prescription:  Initial Exercise Prescription - 05/02/21 1100       Date of Initial Exercise RX and Referring Provider   Date 05/02/21    Referring Provider Serafina Royals MD      Oxygen   Maintain Oxygen Saturation 88% or higher      Treadmill   MPH 2.5    Grade 0.5    Minutes 15    METs 3.09      Recumbant Bike   Level 2    RPM 60    Watts 30    Minutes 15    METs 3.7      NuStep   Level 2    SPM 80    Minutes 15    METs 3.7      REL-XR   Level 2    Speed 50    Minutes 15    METs 3.7      Track   Laps 32    Minutes 15    METs 2.74      Prescription Details   Frequency (times per week) 2    Duration Progress to 30 minutes of  continuous aerobic without signs/symptoms of physical distress      Intensity   THRR 40-80% of Max Heartrate 106 - 138    Ratings of Perceived Exertion 11-13    Perceived Dyspnea 0-4      Progression   Progression Continue to progress workloads to maintain intensity without signs/symptoms of physical distress.      Resistance Training   Training Prescription Yes  Weight 4 lb    Reps 10-15             Perform Capillary Blood Glucose checks as needed.  Exercise Prescription Changes:   Exercise Prescription Changes     Row Name 05/02/21 1100 05/08/21 1500 05/23/21 1600 06/05/21 1500       Response to Exercise   Blood Pressure (Admit) 112/66 110/60 110/62 98/56    Blood Pressure (Exercise) 124/62 142/70 142/72 124/72    Blood Pressure (Exit) 116/62 124/64 128/60 102/58    Heart Rate (Admit) 74 bpm 69 bpm 76 bpm 55 bpm    Heart Rate (Exercise) 86 bpm 96 bpm 110 bpm 11 bpm    Heart Rate (Exit) 73 bpm 77 bpm 90 bpm 86 bpm    Oxygen Saturation (Admit) 98 % -- -- --    Oxygen Saturation (Exercise) 97 % -- -- --    Rating of Perceived Exertion (Exercise) '11 13 13 13    '$ Perceived Dyspnea (Exercise) 0 -- -- --    Symptoms none none none none    Comments walk test results first full day of exercise -- --    Duration -- Continue with 30 min of aerobic exercise without signs/symptoms of physical distress. Continue with 30 min of aerobic exercise without signs/symptoms of physical distress. Continue with 30 min of aerobic exercise without signs/symptoms of physical distress.    Intensity -- THRR unchanged THRR unchanged THRR unchanged      Progression   Progression -- Continue to progress workloads to maintain intensity without signs/symptoms of physical distress. Continue to progress workloads to maintain intensity without signs/symptoms of physical distress. Continue to progress workloads to maintain intensity without signs/symptoms of physical distress.    Average METs -- 2.55  3.36 3.22      Resistance Training   Training Prescription -- Yes Yes Yes    Weight -- 4 lb 6 lb 6 lb    Reps -- 10-15 10-15 10-15      Interval Training   Interval Training -- No No No      Treadmill   MPH -- -- 3.2 3.3    Grade -- -- 1 2    Minutes -- -- 15 15    METs -- -- 3.98 4.44      Recumbant Bike   Level -- -- 2 --    Watts -- -- 20 --    Minutes -- -- 15 --      NuStep   Level -- 2 4 --    Minutes -- 15 15 --    METs -- 2.2 2.5 --      Recumbant Elliptical   Level -- -- -- 1.1    Minutes -- -- -- 15    METs -- -- -- 2      REL-XR   Level -- -- 1 3    Minutes -- -- 15 15    METs -- -- 4.1 --      Track   Laps -- 35 39 --    Minutes -- 15 15 --    METs -- 2.9 3.12 --      Oxygen   Maintain Oxygen Saturation -- 88% or higher 88% or higher 88% or higher             Exercise Comments:   Exercise Comments     Row Name 05/04/21 0718 06/08/21 0804         Exercise Comments First full day of  exercise!  Patient was oriented to gym and equipment including functions, settings, policies, and procedures.  Patient's individual exercise prescription and treatment plan were reviewed.  All starting workloads were established based on the results of the 6 minute walk test done at initial orientation visit.  The plan for exercise progression was also introduced and progression will be customized based on patient's performance and goals. Christopher Huff graduated today from  rehab with 11 sessions completed.  Details of the patient's exercise prescription and what He needs to do in order to continue the prescription and progress were discussed with patient.  Patient was given a copy of prescription and goals.  Patient verbalized understanding.  Christopher Huff plans to continue to exercise by walking at home.               Exercise Goals and Review:   Exercise Goals     Row Name 05/02/21 1111             Exercise Goals   Increase Physical Activity Yes       Intervention  Provide advice, education, support and counseling about physical activity/exercise needs.;Develop an individualized exercise prescription for aerobic and resistive training based on initial evaluation findings, risk stratification, comorbidities and participant's personal goals.       Expected Outcomes Short Term: Attend rehab on a regular basis to increase amount of physical activity.;Long Term: Exercising regularly at least 3-5 days a week.;Long Term: Add in home exercise to make exercise part of routine and to increase amount of physical activity.       Increase Strength and Stamina Yes       Intervention Provide advice, education, support and counseling about physical activity/exercise needs.;Develop an individualized exercise prescription for aerobic and resistive training based on initial evaluation findings, risk stratification, comorbidities and participant's personal goals.       Expected Outcomes Short Term: Increase workloads from initial exercise prescription for resistance, speed, and METs.;Short Term: Perform resistance training exercises routinely during rehab and add in resistance training at home;Long Term: Improve cardiorespiratory fitness, muscular endurance and strength as measured by increased METs and functional capacity (6MWT)       Able to understand and use rate of perceived exertion (RPE) scale Yes       Intervention Provide education and explanation on how to use RPE scale       Expected Outcomes Short Term: Able to use RPE daily in rehab to express subjective intensity level;Long Term:  Able to use RPE to guide intensity level when exercising independently       Able to understand and use Dyspnea scale Yes       Intervention Provide education and explanation on how to use Dyspnea scale       Expected Outcomes Short Term: Able to use Dyspnea scale daily in rehab to express subjective sense of shortness of breath during exertion;Long Term: Able to use Dyspnea scale to guide  intensity level when exercising independently       Knowledge and understanding of Target Heart Rate Range (THRR) Yes       Intervention Provide education and explanation of THRR including how the numbers were predicted and where they are located for reference       Expected Outcomes Short Term: Able to state/look up THRR;Long Term: Able to use THRR to govern intensity when exercising independently;Short Term: Able to use daily as guideline for intensity in rehab       Able to check pulse independently Yes  Intervention Provide education and demonstration on how to check pulse in carotid and radial arteries.;Review the importance of being able to check your own pulse for safety during independent exercise       Expected Outcomes Long Term: Able to check pulse independently and accurately;Short Term: Able to explain why pulse checking is important during independent exercise       Understanding of Exercise Prescription Yes       Intervention Provide education, explanation, and written materials on patient's individual exercise prescription       Expected Outcomes Short Term: Able to explain program exercise prescription;Long Term: Able to explain home exercise prescription to exercise independently                Exercise Goals Re-Evaluation :  Exercise Goals Re-Evaluation     Row Name 05/04/21 0718 05/16/21 0735 05/23/21 0728 05/23/21 1617 06/05/21 1458     Exercise Goal Re-Evaluation   Exercise Goals Review Increase Physical Activity;Able to understand and use rate of perceived exertion (RPE) scale;Understanding of Exercise Prescription;Knowledge and understanding of Target Heart Rate Range (THRR);Increase Strength and Stamina;Able to understand and use Dyspnea scale;Able to check pulse independently Increase Physical Activity;Able to understand and use rate of perceived exertion (RPE) scale;Understanding of Exercise Prescription;Knowledge and understanding of Target Heart Rate Range  (THRR);Increase Strength and Stamina;Able to understand and use Dyspnea scale;Able to check pulse independently Increase Physical Activity;Able to understand and use rate of perceived exertion (RPE) scale;Understanding of Exercise Prescription;Knowledge and understanding of Target Heart Rate Range (THRR);Increase Strength and Stamina;Able to understand and use Dyspnea scale;Able to check pulse independently Increase Physical Activity;Increase Strength and Stamina;Understanding of Exercise Prescription Increase Physical Activity;Increase Strength and Stamina;Understanding of Exercise Prescription   Comments Reviewed RPE and dyspnea scales, THR and program prescription with pt today.  Pt voiced understanding and was given a copy of goals to take home. Reviewed home exercise with pt today.  Pt plans to walk and use treadmill at home for exercise.  Reviewed THR, pulse, RPE, sign and symptoms, pulse oximetery and when to call 911 or MD.  Also discussed weather considerations and indoor options.  Pt voiced understanding. Patient reports that he is walking using weights at home when not in rehab. He remains active and reports no new concerns with his at home exercise. He continues to attend rehab consistently and is considering graduating early. He will update staff on that once he gets back from vacation. Christopher Huff is doing well for the first couple of weeks that he has been here. He has already reached 39 laps on the track! He is also working almost to 4 METS on the treadmill and is now using 6 lb handweights. Will continue to monitor. Christopher Huff is doing well in rehab.  He was out on vacation last week.  He is up to 6 lb hand weights and 2% grade on the treadmill.  When he started program, he was thinking about an early graduation and he plans to let us know what he would like to do when he returns this week.  We will continue to monitor his progress.   Expected Outcomes Short: Use RPE daily to regulate intensity. Long: Follow  program prescription in THR. Short: Continue to use treadmill at home Long: Conitnue to exercise independently Short: continue to exercise at home on off days of rehab. Long: become independent with exercise program. Short: Increase level on XR Long: Continue to increase overall strength and stamina Short: Continue to boost workloads Long:  Continue to improve stamina            Discharge Exercise Prescription (Final Exercise Prescription Changes):  Exercise Prescription Changes - 06/05/21 1500       Response to Exercise   Blood Pressure (Admit) 98/56    Blood Pressure (Exercise) 124/72    Blood Pressure (Exit) 102/58    Heart Rate (Admit) 55 bpm    Heart Rate (Exercise) 11 bpm    Heart Rate (Exit) 86 bpm    Rating of Perceived Exertion (Exercise) 13    Symptoms none    Duration Continue with 30 min of aerobic exercise without signs/symptoms of physical distress.    Intensity THRR unchanged      Progression   Progression Continue to progress workloads to maintain intensity without signs/symptoms of physical distress.    Average METs 3.22      Resistance Training   Training Prescription Yes    Weight 6 lb    Reps 10-15      Interval Training   Interval Training No      Treadmill   MPH 3.3    Grade 2    Minutes 15    METs 4.44      Recumbant Elliptical   Level 1.1    Minutes 15    METs 2      REL-XR   Level 3    Minutes 15      Oxygen   Maintain Oxygen Saturation 88% or higher             Nutrition:  Target Goals: Understanding of nutrition guidelines, daily intake of sodium '1500mg'$ , cholesterol '200mg'$ , calories 30% from fat and 7% or less from saturated fats, daily to have 5 or more servings of fruits and vegetables.  Education: All About Nutrition: -Group instruction provided by verbal, written material, interactive activities, discussions, models, and posters to present general guidelines for heart healthy nutrition including fat, fiber, MyPlate, the  role of sodium in heart healthy nutrition, utilization of the nutrition label, and utilization of this knowledge for meal planning. Follow up email sent as well. Written material given at graduation.   Biometrics:  Pre Biometrics - 05/02/21 1054       Pre Biometrics   Height 5' 9.3" (1.76 m)    Weight 114 lb 12.8 oz (52.1 kg)    BMI (Calculated) 16.81    Single Leg Stand 14.3 seconds             Post Biometrics - 06/08/21 0732        Post  Biometrics   Height 5' 9.3" (1.76 m)    Weight 112 lb 12.8 oz (51.2 kg)    BMI (Calculated) 16.52             Nutrition Therapy Plan and Nutrition Goals:  Nutrition Therapy & Goals - 05/09/21 0912       Nutrition Therapy   Diet High calorie, high protein   secondary: heart healthy, low Na, T2DM MNT, cirrhosis MNT            Nutrition Assessments:  MEDIFICTS Score Key: ?70 Need to make dietary changes  40-70 Heart Healthy Diet ? 40 Therapeutic Level Cholesterol Diet  Flowsheet Row Cardiac Rehab from 06/08/2021 in [facility] Cardiac and Pulmonary Rehab  Picture Your Plate Total Score on Admission 60  Picture Your Plate Total Score on Discharge 64      Picture Your Plate Scores: <10 Unhealthy dietary pattern with much room for improvement.  41-50 Dietary pattern unlikely to meet recommendations for good health and room for improvement. 51-60 More healthful dietary pattern, with some room for improvement.  >60 Healthy dietary pattern, although there may be some specific behaviors that could be improved.    Nutrition Goals Re-Evaluation:  Nutrition Goals Re-Evaluation     [place] Name 05/23/21 0732             Goals   Nutrition Goal ST: add fat and CHO with fiber to meals to increase calories, choose a higher calorie supplement 2x/day and/or homemade high calorie/protein smoothie  LT: gain weight while managing other medical conditions       Comment Patient reports tying to incorporate higher calorie supplements in his diet  to help with weight gain. He has reported weight gain of a couple pounds.       Expected Outcome Short: Continue to work on consuming healthy higher calorie foods and supplements. Long: gain weight while managing other medical conditions.                Nutrition Goals Discharge (Final Nutrition Goals Re-Evaluation):  Nutrition Goals Re-Evaluation - 05/23/21 0732       Goals   Nutrition Goal ST: add fat and CHO with fiber to meals to increase calories, choose a higher calorie supplement 2x/day and/or homemade high calorie/protein smoothie  LT: gain weight while managing other medical conditions    Comment Patient reports tying to incorporate higher calorie supplements in his diet to help with weight gain. He has reported weight gain of a couple pounds.    Expected Outcome Short: Continue to work on consuming healthy higher calorie foods and supplements. Long: gain weight while managing other medical conditions.             Psychosocial: Target Goals: Acknowledge presence or absence of significant depression and/or stress, maximize coping skills, provide positive support system. Participant is able to verbalize types and ability to use techniques and skills needed for reducing stress and depression.   Education: Stress, Anxiety, and Depression - Group verbal and visual presentation to define topics covered.  Reviews how body is impacted by stress, anxiety, and depression.  Also discusses healthy ways to reduce stress and to treat/manage anxiety and depression.  Written material given at graduation.   Education: Sleep Hygiene -Provides group verbal and written instruction about how sleep can affect your health.  Define sleep hygiene, discuss sleep cycles and impact of sleep habits. Review good sleep hygiene tips.    Initial Review & Psychosocial Screening:  Initial Psych Review & Screening - 04/26/21 0943       Initial Review   Current issues with None Identified      Family  Dynamics   Good Support System? Yes   Wife ,friends     Barriers   Psychosocial barriers to participate in program There are no identifiable barriers or psychosocial needs.      Screening Interventions   Interventions Encouraged to exercise;Provide feedback about the scores to participant;To provide support and resources with identified psychosocial needs    Expected Outcomes Short Term goal: Utilizing psychosocial counselor, staff and physician to assist with identification of specific Stressors or current issues interfering with healing process. Setting desired goal for each stressor or current issue identified.;Long Term Goal: Stressors or current issues are controlled or eliminated.;Short Term goal: Identification and review with participant of any Quality of Life or Depression concerns found by scoring the questionnaire.;Long Term goal: The participant improves quality of  Life and PHQ9 Scores as seen by post scores and/or verbalization of changes             Quality of Life Scores:   Quality of Life - 06/08/21 0738       Quality of Life Scores   Health/Function Pre 21.07 %    Health/Function Post 21 %    Health/Function % Change -0.33 %    Socioeconomic Pre 25.64 %    Socioeconomic Post 20.81 %    Socioeconomic % Change  -18.84 %    Psych/Spiritual Pre 18 %    Psych/Spiritual Post 16.29 %    Psych/Spiritual % Change -9.5 %    Family Pre 27.6 %    Family Post 27.3 %    Family % Change -1.09 %    GLOBAL Pre 22.34 %    GLOBAL Post 20.91 %    GLOBAL % Change -6.4 %            Scores of 19 and below usually indicate a poorer quality of life in these areas.  A difference of  2-3 points is a clinically meaningful difference.  A difference of 2-3 points in the total score of the Quality of Life Index has been associated with significant improvement in overall quality of life, self-image, physical symptoms, and general health in studies assessing change in quality of  life.  PHQ-9: Review Flowsheet       06/08/2021 05/02/2021  Depression screen PHQ 2/9  Decreased Interest 1 2  Down, Depressed, Hopeless 1 1  PHQ - 2 Score 2 3  Altered sleeping 1 0  Tired, decreased energy 1 1  Change in appetite 1 0  Feeling bad or failure about yourself  1 1  Trouble concentrating 0 0  Moving slowly or fidgety/restless 0 0  Suicidal thoughts 0 0  PHQ-9 Score 6 5  Difficult doing work/chores Not difficult at all Somewhat difficult   Interpretation of Total Score  Total Score Depression Severity:  1-4 = Minimal depression, 5-9 = Mild depression, 10-14 = Moderate depression, 15-19 = Moderately severe depression, 20-27 = Severe depression   Psychosocial Evaluation and Intervention:  Psychosocial Evaluation - 04/26/21 0959       Psychosocial Evaluation & Interventions   Interventions Encouraged to exercise with the program and follow exercise prescription    Comments Christopher Huff has no barriers to attending the program. He is looking forward to learning all he can to keep himself as healthy as possible. He lives with his wife. THey have been together 30 years. She and his friends are his support. He continues to use snuff and is working toward quitting. He does not have a quit date. He is concerned that he has lost weight . He weigned 132 lb last August and he is 115 lb today. He likes hearing that we have a RDN here to work with him. He should do well.    Expected Outcomes STG Christopher Huff attends all scheduled sessions, he is able to meet with RDN for advice that should enable him to gain back some weight . He will continue to work on tobacco cessation .  LTG Christopher Huff continues to work on exercise, weight control and tobacco cessation after discharge    Continue Psychosocial Services  Follow up required by staff             Psychosocial Re-Evaluation:  Psychosocial Re-Evaluation     Christopher Huff Name 05/23/21 [phone]  Psychosocial Re-Evaluation   Current issues with  None Identified       Comments Patient reports no new concerns with stress or sleep. He reports maintaining good mental health.       Expected Outcomes Short: continue healthy sleep and mental health patterns. Long: independently maintain good mental health.       Interventions Encouraged to attend Cardiac Rehabilitation for the exercise       Continue Psychosocial Services  Follow up required by staff                Psychosocial Discharge (Final Psychosocial Re-Evaluation):  Psychosocial Re-Evaluation - 05/23/21 0731       Psychosocial Re-Evaluation   Current issues with None Identified    Comments Patient reports no new concerns with stress or sleep. He reports maintaining good mental health.    Expected Outcomes Short: continue healthy sleep and mental health patterns. Long: independently maintain good mental health.    Interventions Encouraged to attend Cardiac Rehabilitation for the exercise    Continue Psychosocial Services  Follow up required by staff             Vocational Rehabilitation: Provide vocational rehab assistance to qualifying candidates.   Vocational Rehab Evaluation & Intervention:   Education: Education Goals: Education classes will be provided on a variety of topics geared toward better understanding of heart health and risk factor modification. Participant will state understanding/return demonstration of topics presented as noted by education test scores.  Learning Barriers/Preferences:   General Cardiac Education Topics:  AED/CPR: - Group verbal and written instruction with the use of models to demonstrate the basic use of the AED with the basic ABC's of resuscitation.   Anatomy and Cardiac Procedures: - Group verbal and visual presentation and models provide information about basic cardiac anatomy and function. Reviews the testing methods done to diagnose heart disease and the outcomes of the test results. Describes the treatment choices:  Medical Management, Angioplasty, or Coronary Bypass Surgery for treating various heart conditions including Myocardial Infarction, Angina, Valve Disease, and Cardiac Arrhythmias.  Written material given at graduation.   Medication Safety: - Group verbal and visual instruction to review commonly prescribed medications for heart and lung disease. Reviews the medication, class of the drug, and side effects. Includes the steps to properly store meds and maintain the prescription regimen.  Written material given at graduation.   Intimacy: - Group verbal instruction through game format to discuss how heart and lung disease can affect sexual intimacy. Written material given at graduation.. Flowsheet Row Cardiac Rehab from 05/04/2021 in [facility] Cardiac and Pulmonary Rehab  Date 05/04/21  Educator [facility]  Instruction Review Code 1- Verbalizes Understanding       Know Your Numbers and Heart Failure: - Group verbal and visual instruction to discuss disease risk factors for cardiac and pulmonary disease and treatment options.  Reviews associated critical values for Overweight/Obesity, Hypertension, Cholesterol, and Diabetes.  Discusses basics of heart failure: signs/symptoms and treatments.  Introduces Heart Failure Zone chart for action plan for heart failure.  Written material given at graduation.   Infection Prevention: - Provides verbal and written material to individual with discussion of infection control including proper hand washing and proper equipment cleaning during exercise session. Flowsheet Row Cardiac Rehab from 05/04/2021 in [facility] Cardiac and Pulmonary Rehab  Education need identified 05/02/21  Date 05/02/21  Educator [place]  Instruction Review Code 1- Verbalizes Understanding       Falls Prevention: - Provides verbal and written  material to individual with discussion of falls prevention and safety. Flowsheet Row Cardiac Rehab from 05/04/2021 in [facility] Cardiac and Pulmonary Rehab  Education need  identified 05/02/21  Date 05/02/21  Educator [place]  Instruction Review Code 1- Verbalizes Understanding       Other: -Provides group and verbal instruction on various topics (see comments)   Knowledge Questionnaire Score:  Knowledge Questionnaire Score - 06/08/21 0738       Knowledge Questionnaire Score   Pre Score 25/26: Exercise    Post Score 24/26             Core Components/Risk Factors/Patient Goals at Admission:  Personal Goals and Risk Factors at Admission - 05/02/21 1112       Core Components/Risk Factors/Patient Goals on Admission    Weight Management Yes;Weight Gain    Intervention Weight Management: Develop a combined nutrition and exercise program designed to reach desired caloric intake, while maintaining appropriate intake of nutrient and fiber, sodium and fats, and appropriate energy expenditure required for the weight goal.;Weight Management: Provide education and appropriate resources to help participant work on and attain dietary goals.;Weight Management/Obesity: Establish reasonable short term and long term weight goals.    Admit Weight 114 lb (51.7 kg)    Goal Weight: Short Term 119 lb (54 kg)    Goal Weight: Long Term 140 lb (63.5 kg)    Expected Outcomes Short Term: Continue to assess and modify interventions until short term weight is achieved;Long Term: Adherence to nutrition and physical activity/exercise program aimed toward attainment of established weight goal;Weight Gain: Understanding of general recommendations for a high calorie, high protein meal plan that promotes weight gain by distributing calorie intake throughout the day with the consumption for 4-5 meals, snacks, and/or supplements;Understanding recommendations for meals to include 15-35% energy as protein, 25-35% energy from fat, 35-60% energy from carbohydrates, less than '200mg'$  of dietary cholesterol, 20-35 gm of total fiber daily;Understanding of distribution of calorie intake throughout the  day with the consumption of 4-5 meals/snacks    Tobacco Cessation Yes    Number of packs per day Christopher Huff is a current tobacco user. Intervention for tobacco cessation was provided at the initial medical review. He was asked about readiness to quit and reported he is slowly working on quitting. He is currently using snuff where 1 can lasts him about 5 days. Patient was advised and educated about tobacco cessation using combination therapy, tobacco cessation classes, quit line, and quit smoking apps. Patient demonstrated understanding of this material. Staff will continue to provide encouragement and follow up with the patient throughout the program.  He reports that he is using a can of snuff over 4-5 days    Intervention Assist the participant in steps to quit. Provide individualized education and counseling about committing to Tobacco Cessation, relapse prevention, and pharmacological support that can be provided by physician.;Advice worker, assist with locating and accessing local/national Quit Smoking programs, and support quit date choice.    Expected Outcomes Short Term: Will demonstrate readiness to quit, by selecting a quit date.;Short Term: Will quit all tobacco product use, adhering to prevention of relapse plan.;Long Term: Complete abstinence from all tobacco products for at least 12 months from quit date.    Diabetes Yes    Intervention Provide education about signs/symptoms and action to take for hypo/hyperglycemia.;Provide education about proper nutrition, including hydration, and aerobic/resistive exercise prescription along with prescribed medications to achieve blood glucose in normal ranges: Fasting glucose 65-99 mg/dL    Expected  Outcomes Short Term: Participant verbalizes understanding of the signs/symptoms and immediate care of hyper/hypoglycemia, proper foot care and importance of medication, aerobic/resistive exercise and nutrition plan for blood glucose control.;Long Term:  Attainment of HbA1C < 7%.    Hypertension Yes    Intervention Provide education on lifestyle modifcations including regular physical activity/exercise, weight management, moderate sodium restriction and increased consumption of fresh fruit, vegetables, and low fat dairy, alcohol moderation, and smoking cessation.;Monitor prescription use compliance.    Expected Outcomes Short Term: Continued assessment and intervention until BP is < 140/27m HG in hypertensive participants. < 130/877mHG in hypertensive participants with diabetes, heart failure or chronic kidney disease.;Long Term: Maintenance of blood pressure at goal levels.    Lipids Yes    Intervention Provide education and support for participant on nutrition & aerobic/resistive exercise along with prescribed medications to achieve LDL '70mg'$ , HDL >'40mg'$ .    Expected Outcomes Short Term: Participant states understanding of desired cholesterol values and is compliant with medications prescribed. Participant is following exercise prescription and nutrition guidelines.;Long Term: Cholesterol controlled with medications as prescribed, with individualized exercise RX and with personalized nutrition plan. Value goals: LDL < '70mg'$ , HDL > 40 mg.             Education:Diabetes - Individual verbal and written instruction to review signs/symptoms of diabetes, desired ranges of glucose level fasting, after meals and with exercise. Acknowledge that pre and post exercise glucose checks will be done for 3 sessions at entry of program. [facility]  Education need identified 05/02/21  Date 05/02/21  [place] 1- Verbalizes Understanding       Core Components/Risk Factors/Patient Goals Review:   Goals and Risk Factor Review     Row Name 05/23/21 0734 05/23/21 0737           Core Components/Risk Factors/Patient Goals Review   Personal Goals Review Weight  Management/Obesity;Tobacco Cessation;Hypertension;Diabetes Weight Management/Obesity;Lipids;Diabetes;Hypertension;Tobacco Cessation      Review Patient reports that he monitors blood sugars and blood pressure at home and his levels are within acceptable ranges and consistent. He takes all medications as prescribed and consistently attends cardiac rehab for exercise. He has reported some weight gain and continues to work on eating healthy higher calorie foods to aid in weight gain. He reports that he does continue to use dip tobacco and does now have current plans to quit. Patient reports that he montiors blood pressure and blood sugar at home. All values are reported to be within acceptable ranges. He takes all meds as prescribed and attends cardiac rehab consistently for exercise. He reports that he still currently uses dip tobacco and does not have plans to quit at the moment. He continues to incorporate healthy higher calorie foods  into his diet to help with weight gain. He reports a weight gain of a couple pounds.      Expected Outcomes Short: Short: Continue to work on weight gain. Long:contiue to work on controling cardiac risk factors.               Core Components/Risk Factors/Patient Goals at Discharge (Final Review):   Goals and Risk Factor Review - 05/23/21 0737       Core Components/Risk Factors/Patient Goals Review   Personal Goals Review Weight Management/Obesity;Lipids;Diabetes;Hypertension;Tobacco Cessation    Review Patient reports that he montiors blood pressure and blood sugar at home. All values are reported to be within acceptable ranges. He  takes all meds as prescribed and attends cardiac rehab consistently for exercise. He reports that he still currently uses dip tobacco and does not have plans to quit at the moment. He continues to incorporate healthy higher calorie foods  into his diet to help with weight gain. He reports a weight gain of a couple pounds.    Expected  Outcomes Short: Continue to work on weight gain. Long:contiue to work on controling cardiac risk factors.             ITP Comments:  ITP Comments     Row Name 04/26/21 1005 05/02/21 1052 05/04/21 0717 05/09/21 0855 05/17/21 0918   ITP Comments Virtual orientation call completed today. he has an appointment on Date: [id number]  for EP eval and gym Orientation.  Documentation of diagnosis can be found in [facility]  Date: 04/09/2021 .    Christopher Huff is a current tobacco user. Intervention for tobacco cessation was provided at the initial medical review. He was asked about readiness to quit and reported he is slowly working on quitting . Patient was advised and educated about tobacco cessation using combination therapy, tobacco cessation classes, quit line, and quit smoking apps. Patient demonstrated understanding of this material. Staff will continue to provide encouragement and follow up with the patient throughout the program. Completed 6MWT and gym orientation. Initial ITP created and sent for review to Dr. Emily Filbert, Medical Director. First full day of exercise!  Patient was oriented to gym and equipment including functions, settings, policies, and procedures.  Patient's individual exercise prescription and treatment plan were reviewed.  All starting workloads were established based on the results of the 6 minute walk test done at initial orientation visit.  The plan for exercise progression was also introduced and progression will be customized based on patient's performance and goals. Completed initial RD consultation 30 Day review completed. Medical Director ITP review done, changes made as directed, and signed approval by Medical Director.    Christopher Huff Name 06/08/21 0804           ITP Comments Christopher Huff graduated today from  rehab with 11 sessions completed.  Details of the patient's exercise prescription and what He needs to do in order to continue the prescription and progress were discussed with patient.   Patient was given a copy of prescription and goals.  Patient verbalized understanding.  Christopher Huff plans to continue to exercise by walking at home.                Comments: discharge ITP

## 2021-06-08 NOTE — Progress Notes (Signed)
Daily Session Note  Patient Details  Name: Christopher Huff MRN: [id number] Date of Birth: 29-Oct-1955 Referring Provider:   Flowsheet Row Cardiac Rehab from 05/02/2021 in [facility] Cardiac and Pulmonary Rehab  Referring Provider Serafina Royals MD       Encounter Date: 06/08/2021  Check In:  Session Check In - 06/08/21 0728       Check-In   Supervising physician immediately available to respond to emergencies See telemetry face sheet for immediately available ER MD    Location [facility]-Cardiac & Pulmonary Rehab    Staff Present Coralie Keens, MS, ASCM CEP, Exercise Physiologist;Joseph Karie Fetch, MPA, RN    Virtual Visit No    Medication changes reported     No    Fall or balance concerns reported    No    Tobacco Cessation No Change    Warm-up and Cool-down Performed on first and last piece of equipment    Resistance Training Performed Yes    VAD Patient? No    PAD/SET Patient? No      Pain Assessment   Currently in Pain? No/denies                Social History   Tobacco Use  Smoking Status Never  Smokeless Tobacco Current   Types: Snuff  Tobacco Comments   Quit before surgery, surgery cancelled started back . Working on cessation     Goals Met:  Independence with exercise equipment Exercise tolerated well No report of concerns or symptoms today Strength training completed today  Goals Unmet:  Not Applicable  Comments:  [name] graduated today from  rehab with 11 sessions completed.  Details of the patient's exercise prescription and what He needs to do in order to continue the prescription and progress were discussed with patient.  Patient was given a copy of prescription and goals.  Patient verbalized understanding.  [name] plans to continue to exercise by walking at home.    [place] Name 05/02/21 1053 06/08/21 0731       6 Minute Walk   Phase Initial Discharge    Distance 1350 feet 1510 feet    Distance % Change -- 11.85 %     Distance Feet Change -- 160 ft    Walk Time 6 minutes 6 minutes    # of Rest Breaks 0 0    MPH 2.55 2.85    METS 3.72 3.94    RPE 11 13    Perceived Dyspnea  0 1    VO2 Peak 13.04 13.79    Symptoms No No    Resting HR 74 bpm 66 bpm    Resting BP 112/66 112/62    Resting Oxygen Saturation  98 % 98 %    Exercise Oxygen Saturation  during 6 min walk 97 % 98 %    Max Ex. HR 86 bpm 81 bpm    Max Ex. BP 124/62 118/64    2 Minute Post BP 116/62 --              Dr. Emily Filbert is Medical Director for [place].  Dr. Ottie Glazier is Medical Director for [facility] Pulmonary Rehabilitation.

## 2021-06-08 NOTE — Progress Notes (Signed)
Discharge Report Referring Provider: Adrian Prows, MD  Christopher Huff graduated today from  rehab with 11 sessions completed.  Details of the patient's exercise prescription and what He needs to do in order to continue the prescription and progress were discussed with patient.  Patient was given a copy of prescription and goals.  Patient verbalized understanding.  Christopher Huff plans to continue to exercise by walking at home.   [place] Name 05/02/21 1053 06/08/21 0731       6 Minute Walk   Phase Initial Discharge    Distance 1350 feet 1510 feet    Distance % Change -- 11.85 %    Distance Feet Change -- 160 ft    Walk Time 6 minutes 6 minutes    # of Rest Breaks 0 0    MPH 2.55 2.85    METS 3.72 3.94    RPE 11 13    Perceived Dyspnea  0 1    VO2 Peak 13.04 13.79    Symptoms No No    Resting HR 74 bpm 66 bpm    Resting BP 112/66 112/62    Resting Oxygen Saturation  98 % 98 %    Exercise Oxygen Saturation  during 6 min walk 97 % 98 %    Max Ex. HR 86 bpm 81 bpm    Max Ex. BP 124/62 118/64    2 Minute Post BP 116/62 --

## 2021-06-20 ENCOUNTER — Ambulatory Visit: Payer: Medicare HMO | Admitting: Oncology

## 2021-06-20 ENCOUNTER — Other Ambulatory Visit: Payer: Medicare HMO

## 2021-07-06 ENCOUNTER — Other Ambulatory Visit: Payer: Self-pay | Admitting: Infectious Diseases

## 2021-07-06 DIAGNOSIS — K7030 Alcoholic cirrhosis of liver without ascites: Secondary | ICD-10-CM

## 2021-07-06 DIAGNOSIS — R0609 Other forms of dyspnea: Secondary | ICD-10-CM

## 2021-07-11 ENCOUNTER — Ambulatory Visit
Admission: RE | Admit: 2021-07-11 | Discharge: 2021-07-11 | Disposition: A | Discharge status: Home / Self Care | Payer: Medicare HMO | Source: Ambulatory Visit | Attending: Infectious Diseases | Admitting: Infectious Diseases

## 2021-07-11 DIAGNOSIS — R0609 Other forms of dyspnea: Secondary | ICD-10-CM

## 2021-07-11 DIAGNOSIS — K7030 Alcoholic cirrhosis of liver without ascites: Secondary | ICD-10-CM

## 2021-08-08 ENCOUNTER — Other Ambulatory Visit: Payer: Self-pay | Admitting: Gastroenterology

## 2021-08-08 DIAGNOSIS — K7460 Unspecified cirrhosis of liver: Secondary | ICD-10-CM

## 2021-09-14 ENCOUNTER — Other Ambulatory Visit: Payer: Self-pay | Admitting: Physician Assistant

## 2021-09-14 DIAGNOSIS — M7502 Adhesive capsulitis of left shoulder: Secondary | ICD-10-CM

## 2021-09-14 DIAGNOSIS — M12812 Other specific arthropathies, not elsewhere classified, left shoulder: Secondary | ICD-10-CM

## 2021-09-27 ENCOUNTER — Ambulatory Visit
Admission: RE | Admit: 2021-09-27 | Discharge: 2021-09-27 | Disposition: A | Discharge status: Home / Self Care | Payer: Medicare HMO | Source: Ambulatory Visit | Attending: Physician Assistant | Admitting: Physician Assistant

## 2021-09-27 DIAGNOSIS — M7502 Adhesive capsulitis of left shoulder: Secondary | ICD-10-CM

## 2021-09-27 DIAGNOSIS — M12812 Other specific arthropathies, not elsewhere classified, left shoulder: Secondary | ICD-10-CM

## 2021-10-10 ENCOUNTER — Other Ambulatory Visit: Payer: Self-pay | Admitting: Orthopedic Surgery

## 2021-10-24 ENCOUNTER — Encounter: Payer: Self-pay | Admitting: Orthopedic Surgery

## 2021-10-27 ENCOUNTER — Other Ambulatory Visit: Payer: Self-pay

## 2021-10-27 ENCOUNTER — Ambulatory Visit
Admission: RE | Disposition: A | Discharge status: Home / Self Care | Payer: Self-pay | Source: Home / Self Care | Attending: Orthopedic Surgery

## 2021-10-27 ENCOUNTER — Ambulatory Visit: Payer: Medicare HMO | Admitting: General Practice

## 2021-10-27 ENCOUNTER — Encounter: Payer: Self-pay | Admitting: Orthopedic Surgery

## 2021-10-27 ENCOUNTER — Ambulatory Visit
Admission: RE | Admit: 2021-10-27 | Discharge: 2021-10-27 | Disposition: A | Discharge status: Home / Self Care | Payer: Medicare HMO | Attending: Orthopedic Surgery | Admitting: Orthopedic Surgery

## 2021-10-27 DIAGNOSIS — K219 Gastro-esophageal reflux disease without esophagitis: Secondary | ICD-10-CM | POA: Diagnosis not present

## 2021-10-27 DIAGNOSIS — M7502 Adhesive capsulitis of left shoulder: Secondary | ICD-10-CM | POA: Diagnosis not present

## 2021-10-27 DIAGNOSIS — M7552 Bursitis of left shoulder: Secondary | ICD-10-CM | POA: Insufficient documentation

## 2021-10-27 DIAGNOSIS — Z7984 Long term (current) use of oral hypoglycemic drugs: Secondary | ICD-10-CM | POA: Diagnosis not present

## 2021-10-27 DIAGNOSIS — E119 Type 2 diabetes mellitus without complications: Secondary | ICD-10-CM | POA: Diagnosis not present

## 2021-10-27 DIAGNOSIS — Z794 Long term (current) use of insulin: Secondary | ICD-10-CM | POA: Diagnosis not present

## 2021-10-27 DIAGNOSIS — M659 Synovitis and tenosynovitis, unspecified: Secondary | ICD-10-CM | POA: Diagnosis present

## 2021-10-27 DIAGNOSIS — S43432A Superior glenoid labrum lesion of left shoulder, initial encounter: Secondary | ICD-10-CM | POA: Insufficient documentation

## 2021-10-27 DIAGNOSIS — K449 Diaphragmatic hernia without obstruction or gangrene: Secondary | ICD-10-CM | POA: Diagnosis not present

## 2021-10-27 DIAGNOSIS — X58XXXA Exposure to other specified factors, initial encounter: Secondary | ICD-10-CM | POA: Diagnosis not present

## 2021-10-27 HISTORY — PX: CLOSED MANIPULATION SHOULDER WITH STERIOD INJECTION: SHX5611

## 2021-10-27 HISTORY — DX: Gastro-esophageal reflux disease without esophagitis: K21.9

## 2021-10-27 LAB — GLUCOSE, CAPILLARY
Glucose-Capillary: 101 mg/dL — ABNORMAL HIGH (ref 70–99)
Glucose-Capillary: 91 mg/dL (ref 70–99)

## 2021-10-27 SURGERY — CLOSED MANIPULATION SHOULDER WITH STEROID INJECTION
Anesthesia: General | Site: Shoulder | Laterality: Left

## 2021-10-27 MED ORDER — OXYCODONE HCL 5 MG/5ML PO SOLN: 5.0000 mg | Freq: Once | ORAL | Status: DC | PRN

## 2021-10-27 MED ORDER — IBUPROFEN 800 MG PO TABS: 800.0000 mg | ORAL_TABLET | Freq: Three times a day (TID) | ORAL | 0 refills | Status: AC

## 2021-10-27 MED ORDER — ONDANSETRON 4 MG PO TBDP: 4.0000 mg | ORAL_TABLET | Freq: Three times a day (TID) | ORAL | 0 refills | Status: DC | PRN

## 2021-10-27 MED ORDER — BUPIVACAINE LIPOSOME 1.3 % IJ SUSP
10.0000 mL | Freq: Once | INTRAMUSCULAR | Status: AC
  Administered 2021-10-27: 10 mL

## 2021-10-27 MED ORDER — TRIAMCINOLONE ACETONIDE 40 MG/ML IJ SUSP
INTRAMUSCULAR | Status: DC | PRN
  Administered 2021-10-27 (×2): 5 mL via INTRAMUSCULAR

## 2021-10-27 MED ORDER — OXYCODONE HCL 5 MG PO TABS: 5.0000 mg | ORAL_TABLET | ORAL | 0 refills | Status: DC | PRN

## 2021-10-27 MED ORDER — SUGAMMADEX SODIUM 200 MG/2ML IV SOLN
INTRAVENOUS | Status: DC | PRN
  Administered 2021-10-27: 200 mg via INTRAVENOUS

## 2021-10-27 MED ORDER — LIDOCAINE HCL (CARDIAC) PF 100 MG/5ML IV SOSY
PREFILLED_SYRINGE | INTRAVENOUS | Status: DC | PRN
  Administered 2021-10-27: 40 mg via INTRAVENOUS
  Administered 2021-10-27: 60 mg via INTRAVENOUS

## 2021-10-27 MED ORDER — FENTANYL CITRATE (PF) 100 MCG/2ML IJ SOLN
INTRAMUSCULAR | Status: DC | PRN
  Administered 2021-10-27 (×2): 50 ug via INTRAVENOUS

## 2021-10-27 MED ORDER — DEXAMETHASONE SODIUM PHOSPHATE 4 MG/ML IJ SOLN
INTRAMUSCULAR | Status: DC | PRN
  Administered 2021-10-27: 4 mg via INTRAVENOUS

## 2021-10-27 MED ORDER — OXYCODONE HCL 5 MG PO TABS: 5.0000 mg | ORAL_TABLET | Freq: Once | ORAL | Status: DC | PRN

## 2021-10-27 MED ORDER — CEFAZOLIN SODIUM-DEXTROSE 2-4 GM/100ML-% IV SOLN
2.0000 g | INTRAVENOUS | Status: AC
  Administered 2021-10-27: 2 mg via INTRAVENOUS

## 2021-10-27 MED ORDER — KETOROLAC TROMETHAMINE 15 MG/ML IJ SOLN
INTRAMUSCULAR | Status: DC | PRN
  Administered 2021-10-27: 30 mg via INTRAVENOUS

## 2021-10-27 MED ORDER — ASPIRIN 325 MG PO TBEC: 325.0000 mg | DELAYED_RELEASE_TABLET | Freq: Every day | ORAL | 0 refills | Status: AC

## 2021-10-27 MED ORDER — MIDAZOLAM HCL 2 MG/2ML IJ SOLN
1.0000 mg | INTRAMUSCULAR | Status: DC | PRN
  Administered 2021-10-27: 1 mg via INTRAVENOUS

## 2021-10-27 MED ORDER — LACTATED RINGERS IV SOLN: INTRAVENOUS | Status: DC

## 2021-10-27 MED ORDER — ONDANSETRON HCL 4 MG/2ML IJ SOLN: 4.0000 mg | Freq: Once | INTRAMUSCULAR | Status: DC | PRN

## 2021-10-27 MED ORDER — GLYCOPYRROLATE 0.2 MG/ML IJ SOLN
INTRAMUSCULAR | Status: DC | PRN
  Administered 2021-10-27: .2 mg via INTRAVENOUS

## 2021-10-27 MED ORDER — PROPOFOL 10 MG/ML IV BOLUS
INTRAVENOUS | Status: DC | PRN
  Administered 2021-10-27: 70 mg via INTRAVENOUS

## 2021-10-27 MED ORDER — ACETAMINOPHEN 500 MG PO TABS
1000.0000 mg | ORAL_TABLET | Freq: Once | ORAL | Status: AC
  Administered 2021-10-27: 1000 mg via ORAL

## 2021-10-27 MED ORDER — ACETAMINOPHEN 500 MG PO TABS: 1000.0000 mg | ORAL_TABLET | Freq: Three times a day (TID) | ORAL | 2 refills | Status: DC

## 2021-10-27 MED ORDER — ROCURONIUM BROMIDE 100 MG/10ML IV SOLN
INTRAVENOUS | Status: DC | PRN
  Administered 2021-10-27: 40 mg via INTRAVENOUS

## 2021-10-27 MED ORDER — ONDANSETRON HCL 4 MG/2ML IJ SOLN
INTRAMUSCULAR | Status: DC | PRN
  Administered 2021-10-27: 4 mg via INTRAVENOUS

## 2021-10-27 MED ORDER — FENTANYL CITRATE PF 50 MCG/ML IJ SOSY: 25.0000 ug | PREFILLED_SYRINGE | INTRAMUSCULAR | Status: DC | PRN

## 2021-10-27 MED ORDER — BUPIVACAINE HCL (PF) 0.5 % IJ SOLN
INTRAMUSCULAR | Status: DC | PRN
  Administered 2021-10-27: 10 mL

## 2021-10-27 MED ORDER — RINGERS IRRIGATION IR SOLN
Status: DC | PRN
  Administered 2021-10-27: 12000 mL
  Administered 2021-10-27 (×2): 6000 mL

## 2021-10-27 MED ORDER — EPINEPHRINE PF 1 MG/ML IJ SOLN
INTRAMUSCULAR | Status: DC | PRN
  Administered 2021-10-27: 4 mL

## 2021-10-27 SURGICAL SUPPLY — 42 items
ADPR IRR PORT MULTIBAG TUBE (MISCELLANEOUS) ×1
ANCH SUT 2.9 PUSHLOCK ANCH (Orthopedic Implant) ×1 IMPLANT
APL PRP STRL LF DISP 70% ISPRP (MISCELLANEOUS) ×1
BLADE SHAVER 4.5X7 STR FR (MISCELLANEOUS) ×1 IMPLANT
BUR BR 5.5 WIDE MOUTH (BURR) ×1 IMPLANT
CANNULA PART THRD DISP 5.75X7 (CANNULA) ×1 IMPLANT
CHLORAPREP W/TINT 26 (MISCELLANEOUS) ×1 IMPLANT
COOLER POLAR GLACIER W/PUMP (MISCELLANEOUS) ×1 IMPLANT
ELECT REM PT RETURN 9FT ADLT (ELECTROSURGICAL) ×1
ELECTRODE REM PT RTRN 9FT ADLT (ELECTROSURGICAL) ×1 IMPLANT
GAUZE SPONGE 4X4 12PLY STRL (GAUZE/BANDAGES/DRESSINGS) ×1 IMPLANT
GAUZE XEROFORM 1X8 LF (GAUZE/BANDAGES/DRESSINGS) ×1 IMPLANT
GLOVE SRG 8 PF TXTR STRL LF DI (GLOVE) ×1 IMPLANT
GLOVE SURG ENC MOIS LTX SZ7.5 (GLOVE) ×2 IMPLANT
GLOVE SURG ENC MOIS LTX SZ8 (GLOVE) ×1 IMPLANT
GLOVE SURG UNDER POLY LF SZ8 (GLOVE) ×1
GOWN STRL REUS W/ TWL LRG LVL3 (GOWN DISPOSABLE) ×1 IMPLANT
GOWN STRL REUS W/ TWL XL LVL3 (GOWN DISPOSABLE) ×1 IMPLANT
GOWN STRL REUS W/TWL LRG LVL3 (GOWN DISPOSABLE) ×1
GOWN STRL REUS W/TWL XL LVL3 (GOWN DISPOSABLE) ×1
IV LACTATED RINGER IRRG 3000ML (IV SOLUTION) ×8
IV LR IRRIG 3000ML ARTHROMATIC (IV SOLUTION) ×6 IMPLANT
KIT STABILIZATION SHOULDER (MISCELLANEOUS) ×1 IMPLANT
KIT TURNOVER KIT A (KITS) ×1 IMPLANT
MANIFOLD NEPTUNE II (INSTRUMENTS) ×2 IMPLANT
MASK FACE SPIDER DISP (MASK) ×1 IMPLANT
MAT ABSORB  FLUID 56X50 GRAY (MISCELLANEOUS) ×2
MAT ABSORB FLUID 56X50 GRAY (MISCELLANEOUS) ×1 IMPLANT
PACK ARTHROSCOPY SHOULDER (MISCELLANEOUS) ×1 IMPLANT
PAD WRAPON POLAR SHDR XLG (MISCELLANEOUS) ×1 IMPLANT
SET Y ADAPTER MULIT-BAG IRRIG (MISCELLANEOUS) ×1 IMPLANT
SLING ARM M TX990204 (SOFTGOODS) IMPLANT
SPONGE T-LAP 18X18 ~~LOC~~+RFID (SPONGE) ×1 IMPLANT
SUT ETHILON 3-0 FS-10 30 BLK (SUTURE) ×1
SUTURE EHLN 3-0 FS-10 30 BLK (SUTURE) IMPLANT
SYR 5ML LL (SYRINGE) ×1 IMPLANT
SYSTEM IMPL TENODESIS LNT 2.9 (Orthopedic Implant) IMPLANT
TAPE MICROFOAM 4IN (TAPE) ×1 IMPLANT
TUBING INFLOW SET DBFLO PUMP (TUBING) ×1 IMPLANT
TUBING OUTFLOW SET DBLFO PUMP (TUBING) ×1 IMPLANT
WAND WEREWOLF FLOW 90D (MISCELLANEOUS) ×1 IMPLANT
WRAPON POLAR PAD SHDR XLG (MISCELLANEOUS) ×1

## 2021-10-27 NOTE — Anesthesia Postprocedure Evaluation (Signed)
Anesthesia Post Note  Patient: Christopher Huff  Procedure(s) Performed: Left shoulder arthroscopic lysis of adhesions, capsular release, and manipulation under anesthesia with corticosteroid injection (Left: Shoulder)  Patient location during evaluation: PACU Anesthesia Type: General Level of consciousness: awake and alert Pain management: pain level controlled Vital Signs Assessment: post-procedure vital signs reviewed and stable Respiratory status: spontaneous breathing, nonlabored ventilation, respiratory function stable and patient connected to nasal cannula oxygen Cardiovascular status: blood pressure returned to baseline and stable Postop Assessment: no apparent nausea or vomiting Anesthetic complications: no   There were no known notable events for this encounter.   Last Vitals:  Vitals:   10/27/21 1045 10/27/21 1100  BP: 131/70 (!) 126/56  Pulse: (!) 57 (!) 56  Resp: 18 18  Temp:  (!) 36.4 C  SpO2: 98% 98%    Last Pain:  Vitals:   10/27/21 1100  TempSrc:   PainSc: 0-No pain                 Arita Miss

## 2021-10-27 NOTE — Op Note (Signed)
OPERATIVE NOTE SURGERY DATE: 10/27/2021  PRE-OP DIAGNOSIS: 1. Left shoulder adhesive capsulitis and synovitis 2. Left subacromial bursitis 3. Left shoulder adhesions   POST-OP DIAGNOSIS:  1. Left shoulder adhesive capsulitis and synovitis 2. Left subacromial bursitis 3. Left shoulder adhesions 4. Left shoulder biceps tendinopathy  PROCEDURES: 1. Left shoulder capsular releases with synovectomy, lysis of adhesions, manipulation under anesthesia  2. Left shoulder extensive glenohumeral debridement with subacromial bursectomy 3. Left shoulder arthroscopic biceps tenodesis   SURGEON:  Cato Mulligan, MD  ASSISTANT(S):  none  ANESTHESIA: Regional block with Exparel, Gen  TOTAL IV FLUIDS: per anesthesia record   ESTIMATED BLOOD LOSS: Minimal  DRAINS:  None.  SPECIMENS: None  IMPLANTS:  Arthrex Pushlock 2.68m anchor x 1  COMPLICATIONS: none  INDICATIONS: JGREYSYN VANDERBERGis a 66y.o. male with complaints of left shoulder pain and stiffness. Preoperative left shoulder examination was notable for severe motion loss and pain. The patient has failed extensive nonoperative management including physical therapy exercises, corticosteroid injections, and medications.  Given this, we discussed surgical intervention.  After discussion of risks, benefits, and alternatives to surgery, the patient elected to proceed.    OPERATIVE FINDINGS:  Operative Shoulder Range of Motion:  Preop  Postop  Flexion  90 150  Abduction  60 120  ER at 0  0 45  ER at 90  50 90  IR at 90  20 50  IR posterior  L2 T6    Intra-operative findings: A thorough arthroscopic examination of the shoulder was performed.  The findings are: 1. Biceps tendon:  Significant tendinopathy of the proximal insertion 2. Superior labrum: injected with surrounding synovitis  3. Posterior labrum and capsule: Significant synovitis about posterior capsule and labrum 4. Inferior capsule and inferior recess: Significant synovitis  and thickening of capsule 5. Glenoid cartilage surface: Grade 1 changes  6. Supraspinatus attachment: Normal 7. Posterior rotator cuff attachment:  Normal 8. Humeral head articular cartilage: normal 9. Rotator interval: significant synovitis and thickening of capsule 10: Subscapularis tendon:  Normal 11. Anterior labrum: Erythematous 12. IGHL: significant synovitis and thickening of the IGHL   DETAILS OF PROCEDURE: The patient was identified in the preoperative holding area. Informed consent was obtained. Operative extremity was marked. After satisfactory upper extremity regional block with Exparel was performed in the preoperative holding area, the patient was brought to the operating room and placed in a well-padded beach chair positioner.  Eyes were protected, head was affixed in neutral, and the patient was given preoperative IV antibiotics within 30 minutes of the start of the case and a surgical time-out occurred. The upper extremity was prescrubbed with Hibiclens and alcohol, prepped with ChloraPrep and draped in the usual sterile fashion.    I then created a standard posterior portal with an 11 blade. The glenohumeral joint was easily entered with a blunt trochar and the arthroscope introduced. The findings of diagnostic arthroscopy are described above.  A standard anterior portal was made.  The joint was remarkable for moderate synovitis which was chronic in the anterior, superior, posterior, and inferior aspects. This required synovectomy with a shaver and Arthrocare device in the affected compartments listed above.  A combination of electrocautery and oscillating shaver was used to debride the rotator interval tissue.  The posterior aspect of the coracoid was exposed.  The anterior and posterior aspects of the subscapularis were cleared of tissue so there was no tethering.  Given the pathology of the biceps tendon and the presence of a SLAP tear,  decision was made to perform arthroscopic  biceps tenodesis.  The Loop n Tack technique was used to pass a FiberTape through the biceps in a locked fashion adjacent to the biceps anchor.  A hole for a 2.9 mm Arthrex PushLock was drilled in the bicipital groove just superior to the subscapularis tendon insertion.  The biceps tendon was then cut and the biceps anchor complex was debrided down to a stable base on the superior labrum.  The FiberTape was loaded onto the PushLock anchor and impacted into place into the previously drilled hole in the bicipital groove.  This appropriately secured the biceps into the bicipital groove and took it off of tension.   Next, an upbiting duckbill basket was then used to perform a capsulotomy of the rotator interval, the MGHL, and then IGHL (anterior band).  Care was taken to protect the intraarticular subscapularis.  Adhesions were cleared off the subscapularis to allow full internal and external rotation.    The arthroscope was placed into the anterior portal.  The posterior capsule was quite thickened and inflamed as well.  After synovectomy, the duckbill basket was used to perform release from the superior glenoid, down into the axillary pouch, around to the anterior band of the IGHL.  A complete 360 capsulotomy was performed in this manner.  Care was taken to protect the axillary nerve by staying on the glenoid side and making sure not to rotate the shoulder externally during the capsulotomy.  Hemostasis was achieved with the ArthroCare wand.  There was no unusual bleeding.  The instruments were removed from the joint.    The arthroscope was placed in the subacromial space. An accessory lateral portal was established. 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  A complete subacromial bursectomy and debridement of the gutters was carried out with a shaver.  ArthroCare was used to control bleeding.   Manipulation under anesthesia was then carried out in a gentle, controlled manner with  short lever arms.  There was gentle, palpable release of adhesions. See above chart for post-manipulation improvement in range of motion.   The skin was closed with interrupted 3-0 nylon sutures. Injections of 40 mg Kenalog with 0.5% bupiivacaine were placed separately in the glenohumeral joint and subacromial space with a spinal needle.  Xeroform gauze, sterile dressings were applied. The patient was placed in a shoulder sling.  Polar Care was applied.    Instrument, sponge, and needle counts were correct prior to closure and at the conclusion of the case.   DISPOSITION: PACU - hemodynamically stable.  POSTOPERATIVE PLAN: The patient will be discharged home. PT to begin for range of motion exercises.  Home exercises were demonstrated the patient until physical therapy appointment.  ASA x 2 weeks for DVT ppx. Sling only for comfort and wean this week as soon as tolerated. RTC in ~2 weeks.

## 2021-10-27 NOTE — Anesthesia Procedure Notes (Signed)
Anesthesia Regional Block: Interscalene brachial plexus block   Pre-Anesthetic Checklist: , timeout performed,  Correct Patient, Correct Site, Correct Laterality,  Correct Procedure, Correct Position, site marked,  Risks and benefits discussed,  Surgical consent,  Pre-op evaluation,  At surgeon's request and post-op pain management  Laterality: Left  Prep: chloraprep       Needles:  Injection technique: Single-shot  Needle Type: Echogenic Needle     Needle Length: 4cm  Needle Gauge: 25     Additional Needles:   Procedures:,,,, ultrasound used (permanent image in chart),,    Narrative:  Injection made incrementally with aspirations every 5 mL.  Performed by: Personally  Anesthesiologist: Randy Whitener, MD  Additional Notes: Patient's chart reviewed and they were deemed appropriate candidate for procedure, at surgeon's request. Patient educated about risks, benefits, and alternatives of the block including but not limited to: temporary or permanent nerve damage, bleeding, infection, damage to surround tissues, pneumothorax, hemidiaphragmatic paralysis, unilateral Horner's syndrome, block failure, local anesthetic toxicity. Patient expressed understanding. A formal time-out was conducted consistent with institution rules.  Monitors were applied, and minimal sedation used (see nursing record). The site was prepped with skin prep and allowed to dry, and sterile gloves were used. A high frequency linear ultrasound probe with probe cover was utilized throughout. C5-7 nerve roots located and appeared anatomically normal, local anesthetic injected around them, and echogenic block needle trajectory was monitored throughout. Aspiration performed every 5ml. Lung and blood vessels were avoided. All injections were performed without resistance and free of blood and paresthesias. The patient tolerated the procedure well.  Injectate: 10ml exparel + 10ml 0.5% bupivacaine      

## 2021-10-27 NOTE — Transfer of Care (Signed)
Immediate Anesthesia Transfer of Care Note  Patient: Christopher Huff  Procedure(s) Performed: Left shoulder arthroscopic lysis of adhesions, capsular release, and manipulation under anesthesia with corticosteroid injection (Left: Shoulder)  Patient Location: PACU  Anesthesia Type: General  Level of Consciousness: awake, alert  and patient cooperative  Airway and Oxygen Therapy: Patient Spontanous Breathing and Patient connected to supplemental oxygen  Post-op Assessment: Post-op Vital signs reviewed, Patient's Cardiovascular Status Stable, Respiratory Function Stable, Patent Airway and No signs of Nausea or vomiting  Post-op Vital Signs: Reviewed and stable  Complications: There were no known notable events for this encounter.

## 2021-10-27 NOTE — Anesthesia Preprocedure Evaluation (Signed)
Anesthesia Evaluation  Patient identified by MRN, date of birth, ID band Patient awake    Reviewed: Allergy & Precautions, NPO status , Patient's Chart, lab work & pertinent test results  History of Anesthesia Complications Negative for: history of anesthetic complications  Airway Mallampati: II  TM Distance: >3 FB Neck ROM: Full    Dental no notable dental hx. 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  INTERPRETATION  NORMAL LEFT VENTRICULAR SYSTOLIC FUNCTION  WITH MILD LVH  NORMAL RIGHT VENTRICULAR SYSTOLIC FUNCTION  MILD VALVULAR REGURGITATION (See above)  MILD AR, MR, TR  BIOPROSTHETIC AoV  EF >55%     Neuro/Psych negative neurological ROS  negative psych ROS   GI/Hepatic hiatal hernia, GERD  Medicated and Controlled,(+)     (-) substance abuse  ,   Endo/Other  diabetes, Well Controlled, Insulin Dependent  Renal/GU negative Renal ROS     Musculoskeletal   Abdominal   Peds  Hematology   Anesthesia Other Findings Past Medical History: No date: Alcohol dependence (HCC) No date: Diabetes mellitus without complication (HCC) No date: GERD (gastroesophageal reflux disease) No date: Hypertension No date: Weight loss  Reproductive/Obstetrics                             Anesthesia Physical Anesthesia Plan  ASA: 2  Anesthesia Plan: General   Post-op Pain Management: Tylenol PO (pre-op) and Regional block   Induction: Intravenous  PONV Risk Score and Plan: 2 and Ondansetron, Dexamethasone and Midazolam  Airway Management Planned: Oral  ETT  Additional Equipment: None  Intra-op Plan:   Post-operative Plan: Extubation in OR  Informed Consent: I have reviewed the patients History and Physical, chart, labs and discussed the procedure including the risks, benefits and alternatives for the proposed anesthesia with the patient or authorized representative who has indicated his/her understanding and acceptance.     Dental advisory given  Plan Discussed with: CRNA and Surgeon  Anesthesia Plan Comments: (Discussed risks of anesthesia with patient, including PONV, sore throat, lip/dental/eye damage. Rare risks discussed as well, such as cardiorespiratory and neurological sequelae, and allergic reactions. Discussed the role of CRNA in patient's perioperative care. Patient understands. Discussed r/b/a of interscalene block, including elective nature. Risks discussed: - Rare: bleeding, infection, nerve damage - shortness of breath from hemidiaphragmatic paralysis - unilateral horner's syndrome - poor/non-working blocks - reactions and toxicity to local anesthetic Patient understands and agrees. )        Anesthesia Quick Evaluation

## 2021-10-27 NOTE — H&P (Signed)
Paper H&P to be scanned into permanent record. H&P reviewed. No significant changes noted.  

## 2021-10-27 NOTE — Discharge Instructions (Addendum)
Post-Op Instructions - Shoulder Capsular Release/Manipulation Under Anesthesia  1. Bracing: You should wear a sling for comfort only. Sling should NOT be worn longer than ~1 week.   2. Driving: No driving for at least 2 weeks post-op. Must be off narcotic pain medication.  3. Activity: No active lifting for ~2 weeks. Perform range of motion exercises DAILY (as described below) at home and with physical therapy as prescribed.   4. Physical Therapy: Begin home stretching exercises the day after surgery.  These are described below. Perform for 10 minutes at a time up to 4-5 times per day.  This should start within 3-4 days of surgery.  Appointment should be scheduled for you already.  Plan to continue for ~6-12 weeks. This should be at least 3x/week for at least the first 2 weeks.   5. Medications:  - You will be provided a prescription for narcotic pain medicine. After surgery, take 1-2 narcotic tablets every 4 hours if needed for severe pain.  - A prescription for anti-nausea medication will be provided in case the narcotic medicine causes nausea - take 1 tablet every 6 hours only if nauseated.   - Take tylenol 1000 mg (2 Extra Strength tablets or 3 regular strength) every 8 hours for pain.  May decrease or stop tylenol 5 days after surgery if you are having minimal pain. - Take ibuprofen '800mg'$  three times/day with food for at least two weeks every day. This will help reduce post-operative inflammation and swelling. Please call our offices if this causes any stomach/GI irritation.  - Take Aspirin '325mg'$ /daily x 2 weeks to help prevent DVT/PE (Blood clots)   If you are taking prescription medication for anxiety, depression, insomnia, muscle spasm, chronic pain, or for attention deficit disorder, you are advised that you are at a higher risk of adverse effects with use of narcotics post-op, including narcotic addiction/dependence, depressed breathing, death. If you use non-prescribed substances:  alcohol, marijuana, cocaine, heroin, methamphetamines, etc., you are at a higher risk of adverse effects with use of narcotics post-op, including narcotic addiction/dependence, depressed breathing, death. You are advised that taking > 50 morphine milligram equivalents (MME) of narcotic pain medication per day results in twice the risk of overdose or death. For your prescription provided: oxycodone 5 mg - taking more than 6 tablets per day would result in > 50 morphine milligram equivalents (MME) of narcotic pain medication. Be advised that we will prescribe narcotics short-term, for acute post-operative pain only - 3 weeks for major operations such as shoulder repair/reconstruction surgeries.    6. Post-Op Appointment:  Your first post-op appointment will be ~2 weeks post-op.  7. Work or School: For most, but not all procedures, we advise staying out of work or school for at least 1 to 2 weeks in order to recover from the stress of surgery and to allow time for healing.   If you need a work or school note this can be provided.     Cato Mulligan, MD  [facility]  Phone: [phone]  Fax: [phone]   Home Exercises  Perform passive, assisted forward flexion and external rotation (outward turning) exercises with the operative arm. You were taught these exercises prior to discharge. Both exercises should be done with the non-operative arm used as the "therapist arm" while the operative arm remains completely relaxed.  Forward Flexion: Lie flat on your back, completely relax your operative arm like a wet noodle, and grasp the wrist of the operative shoulder with your opposite  hand. Using the power in your opposite arm, bring the stiff arm up only to the maximum indicated above (90 degrees indicates your arm pointed straight ahead). Start holding it for ten seconds and then work up to where you can hold it for a count of 30. Breathe slowly and deeply while the arm is moved. Repeat  this stretch ten times.      External rotation: External rotation is turning the arm out to the side while your elbow stays close to your body. It is best stretched while you are lying on your back. Hold a cane, yardstick, broom handle, or golf club in both hands. Bend both elbows to a right angle. With your operative arm completely relaxed, use steady, gentle force from your normal arm to rotate the hand of the stiff shoulder out away from your body. Continue the rotation only to the maximum indicated above (90 degrees indicates your arm pointed straight ahead). Holding it there for a count of 10. Repeat this exercise ten times.          PERIPHERAL NERVE BLOCK PATIENT INFORMATION  Your surgeon has requested a peripheral nerve block for your surgery. This anesthetic technique provides excellent post-operative pain relief for you in a safe and effective manner. It will also help reduce the risk of nausea and vomiting and allow earlier discharge from the hospital.   The block is performed under sedation with ultrasound guidance prior to your procedure. Due to the sedation, your may or may not remember the block experience. The nerve block will begin to take effect anywhere from 5 to 30 minutes after being administered. You will be transported to the operating room from your surgery after the block is completed.   At the end of surgery, when the anesthesia wears off, you will notice a few things. Your may not be able to move or feel the part of your body targeted by the nerve block. These are normal experiences, and they will disappear as the block wears off.  If you had an interscalene nerve block performed (which is common for shoulder surgery), your voice can be very hoarse and you may feel that you are not able to take as deep a breath as you did before surgery. Some patients may also notice a droopy eyelid on the affected side. These symptoms will resolve once the block wears off.  Pain  control: The nerve block technique used is a single injection that can last anywhere from 1-3 days. The duration of the numbness can vary between individuals. After leaving the hospital, it is important that you begin to take your prescribed pain medication when you start to sense the nerve block wearing off. This will help you avoid unpleasant pain at the time the nerve block wears off, which can sometimes be in the middle of the night. The block will only cover pain in the areas targeted by the nerve block so if you experience surgical pain outside of that area, please take your prescribed pain medication. Management of the "numb area": After a nerve block, you cannot feel pain, pressure, or temperature in the affected area so there is an increased risk for injury. You should take extra care to protect the affected areas until sensation and movement returns. Please take caution to not come in contact with extremely hot or cold items because you will not be able to sense or protect yourself form the extremes of temperature.  You may experience some persistent numbness after the  procedure by most neurological deficits resolve over time and the incidence of serious long term neurological complications attributable to peripheral nerve blocks are relatively uncommon.    Information for Discharge Teaching: EXPAREL (bupivacaine liposome injectable suspension)   Your surgeon or anesthesiologist gave you EXPAREL(bupivacaine) to help control your pain after surgery.  EXPAREL is a local anesthetic that provides pain relief by numbing the tissue around the surgical site. EXPAREL is designed to release pain medication over time and can control pain for up to 72 hours. Depending on how you respond to EXPAREL, you may require less pain medication during your recovery.  Possible side effects: Temporary loss of sensation or ability to move in the area where bupivacaine was injected. Nausea, vomiting,  constipation Rarely, numbness and tingling in your mouth or lips, lightheadedness, or anxiety may occur. Call your doctor right away if you think you may be experiencing any of these sensations, or if you have other questions regarding possible side effects.  Follow all other discharge instructions given to you by your surgeon or nurse. Eat a healthy diet and drink plenty of water or other fluids.  If you return to the hospital for any reason within 96 hours following the administration of EXPAREL, it is important for health care providers to know that you have received this anesthetic. A teal colored band has been placed on your arm with the date, time and amount of EXPAREL you have received in order to alert and inform your health care providers. Please leave this armband in place for the full 96 hours following administration, and then you may remove the band.  POLAR CARE INFORMATION  [email or web address]  How to use [place] Cold Therapy System?  YouTube   [email or web address]  OPERATING INSTRUCTIONS  Start the product With dry hands, connect the transformer to the electrical connection located on the top of the cooler. Next, plug the transformer into an appropriate electrical outlet. The unit will automatically start running at this point.  To stop the pump, disconnect electrical power.  Unplug to stop the product when not in use. Unplugging the Polar Care unit turns it off. Always unplug immediately after use. Never leave it plugged in while unattended. Remove pad.    FIRST ADD WATER TO FILL LINE, THEN ICE---Replace ice when existing ice is almost melted  1 Discuss Treatment with your Badger Practitioner and Use Only as Prescribed 2 Apply Insulation Barrier & Cold Therapy Pad 3 Check for Moisture 4 Inspect Skin Regularly  Tips and Trouble Shooting Usage Tips 1. Use cubed or chunked ice for optimal performance. 2. It is recommended to  drain the Pad between uses. To drain the pad, hold the Pad upright with the hose pointed toward the ground. Depress the black plunger and allow water to drain out. 3. You may disconnect the Pad from the unit without removing the pad from the affected area by depressing the silver tabs on the hose coupling and gently pulling the hoses apart. The Pad and unit will seal itself and will not leak. Note: Some dripping during release is normal. 4. DO NOT RUN PUMP WITHOUT WATER! The pump in this unit is designed to run with water. Running the unit without water will cause permanent damage to the pump. 5. Unplug unit before removing lid.  TROUBLESHOOTING GUIDE Pump not running, Water not flowing to the pad, Pad is not getting cold 1. Make sure the transformer is plugged into the wall outlet. 2. Confirm that  the ice and water are filled to the indicated levels. 3. Make sure there are no kinks in the pad. 4. Gently pull on the blue tube to make sure the tube/pad junction is straight. 5. Remove the pad from the treatment site and ll it while the pad is lying at; then reapply. 6. Confirm that the pad couplings are securely attached to the unit. Listen for the double clicks (Figure 1) to confirm the pad couplings are securely attached.  Leaks    Note: Some condensation on the lines, controller, and pads is unavoidable, especially in warmer climates. 1. If using a [place] Polar Care Cold Therapy unit with a detachable Cold Therapy Pad, and a leak exists (other than condensation on the lines) disconnect the pad couplings. Make sure the silver tabs on the couplings are depressed before reconnecting the pad to the pump hose; then confirm both sides of the coupling are properly clicked in. 2. If the coupling continues to leak or a leak is detected in the pad itself, stop using it and call [place] at [phone].  Cleaning After use, empty and dry the unit with a soft cloth. Warm water and mild detergent  may be used occasionally to clean the pump and tubes.  WARNING: The [place] can be cold enough to cause serious injury, including full skin necrosis. Follow these Operating Instructions, and carefully read the Product Insert (see pouch on side of unit) and the Cold Therapy Pad Fitting Instructions (provided with each Cold Therapy Pad) prior to use.

## 2021-10-30 ENCOUNTER — Encounter: Payer: Self-pay | Admitting: Orthopedic Surgery

## 2022-01-08 ENCOUNTER — Ambulatory Visit
Admission: RE | Admit: 2022-01-08 | Discharge: 2022-01-08 | Disposition: A | Discharge status: Home / Self Care | Payer: Medicare HMO | Source: Ambulatory Visit | Attending: Gastroenterology | Admitting: Gastroenterology

## 2022-01-08 DIAGNOSIS — K7460 Unspecified cirrhosis of liver: Secondary | ICD-10-CM | POA: Diagnosis present

## 2022-01-23 ENCOUNTER — Other Ambulatory Visit: Payer: Self-pay | Admitting: Gastroenterology

## 2022-01-23 DIAGNOSIS — K7460 Unspecified cirrhosis of liver: Secondary | ICD-10-CM

## 2022-05-21 ENCOUNTER — Inpatient Hospital Stay: Payer: Medicare HMO | Attending: Oncology | Admitting: Oncology

## 2022-05-21 ENCOUNTER — Inpatient Hospital Stay: Payer: Medicare HMO

## 2022-05-21 ENCOUNTER — Telehealth: Payer: Self-pay

## 2022-05-21 ENCOUNTER — Encounter: Payer: Self-pay | Admitting: Oncology

## 2022-05-21 VITALS — BP 135/71 | HR 70 | Temp 96.3°F | Wt 123.8 lb

## 2022-05-21 DIAGNOSIS — Z79899 Other long term (current) drug therapy: Secondary | ICD-10-CM | POA: Insufficient documentation

## 2022-05-21 DIAGNOSIS — I350 Nonrheumatic aortic (valve) stenosis: Secondary | ICD-10-CM | POA: Diagnosis not present

## 2022-05-21 DIAGNOSIS — D509 Iron deficiency anemia, unspecified: Secondary | ICD-10-CM | POA: Diagnosis present

## 2022-05-21 DIAGNOSIS — D649 Anemia, unspecified: Secondary | ICD-10-CM

## 2022-05-21 DIAGNOSIS — D696 Thrombocytopenia, unspecified: Secondary | ICD-10-CM

## 2022-05-21 DIAGNOSIS — K7030 Alcoholic cirrhosis of liver without ascites: Secondary | ICD-10-CM | POA: Diagnosis not present

## 2022-05-21 DIAGNOSIS — K7460 Unspecified cirrhosis of liver: Secondary | ICD-10-CM | POA: Insufficient documentation

## 2022-05-21 LAB — IRON AND TIBC
Iron: 46 ug/dL (ref 45–182)
Saturation Ratios: 9 % — ABNORMAL LOW (ref 17.9–39.5)
TIBC: 514 ug/dL — ABNORMAL HIGH (ref 250–450)
UIBC: 468 ug/dL

## 2022-05-21 LAB — RETIC PANEL
Immature Retic Fract: 17.8 % — ABNORMAL HIGH (ref 2.3–15.9)
RBC.: 3.79 MIL/uL — ABNORMAL LOW (ref 4.22–5.81)
Retic Count, Absolute: 89.4 10*3/uL (ref 19.0–186.0)
Retic Ct Pct: 2.4 % (ref 0.4–3.1)
Reticulocyte Hemoglobin: 27.4 pg — ABNORMAL LOW (ref >27.9)

## 2022-05-21 LAB — CBC WITH DIFFERENTIAL/PLATELET
Abs Immature Granulocytes: 0.02 10*3/uL (ref 0.00–0.07)
Basophils Absolute: 0.1 10*3/uL (ref 0.0–0.1)
Basophils Relative: 1 %
Eosinophils Absolute: 0.1 10*3/uL (ref 0.0–0.5)
Eosinophils Relative: 2 %
HCT: 32.5 % — ABNORMAL LOW (ref 39.0–52.0)
Hemoglobin: 9.8 g/dL — ABNORMAL LOW (ref 13.0–17.0)
Immature Granulocytes: 0 %
Lymphocytes Relative: 10 %
Lymphs Abs: 0.6 10*3/uL — ABNORMAL LOW (ref 0.7–4.0)
MCH: 25.7 pg — ABNORMAL LOW (ref 26.0–34.0)
MCHC: 30.2 g/dL (ref 30.0–36.0)
MCV: 85.3 fL (ref 80.0–100.0)
Monocytes Absolute: 0.4 10*3/uL (ref 0.1–1.0)
Monocytes Relative: 6 %
Neutro Abs: 5 10*3/uL (ref 1.7–7.7)
Neutrophils Relative %: 81 %
Platelets: 266 10*3/uL (ref 150–400)
RBC: 3.81 MIL/uL — ABNORMAL LOW (ref 4.22–5.81)
RDW: 16.1 % — ABNORMAL HIGH (ref 11.5–15.5)
WBC: 6.1 10*3/uL (ref 4.0–10.5)
nRBC: 0 % (ref 0.0–0.2)

## 2022-05-21 LAB — LACTATE DEHYDROGENASE: LDH: 341 U/L — ABNORMAL HIGH (ref 98–192)

## 2022-05-21 LAB — FERRITIN: Ferritin: 18 ng/mL — ABNORMAL LOW (ref 24–336)

## 2022-05-21 LAB — FOLATE: Folate: 9.4 ng/mL (ref >5.9)

## 2022-05-21 NOTE — Assessment & Plan Note (Signed)
Follow-up with GI 

## 2022-05-21 NOTE — Telephone Encounter (Signed)
-----   Message from Zhou Yu, MD sent at 05/21/2022  1:16 PM EDT ----- Iron deficiency anemia, I have called pt/wife, they agree with IV Venofer.  Please arrange IV Venofer weekly x 4, start this week.  He has appt with me on 6/12, so the 4th Venofer can be on same day. Thanks.  zy 

## 2022-05-21 NOTE — Telephone Encounter (Signed)
-----   Message from Rickard Patience, MD sent at 05/21/2022  1:16 PM EDT ----- Iron deficiency anemia, I have called pt/wife, they agree with IV Venofer.  Please arrange IV Venofer weekly x 4, start this week.  He has appt with me on 6/12, so the 4th Venofer can be on same day. Thanks.  zy

## 2022-05-21 NOTE — Assessment & Plan Note (Addendum)
Normocytic anemia, check CBC, CMP, iron, TIBC ferritin, folate, multiple myeloma panel, light chain ratio, flow cytometry, LDH, haptoglobin, reticulocyte panel, smear.  Original results were reviewed at the time of dictation. Lab Results  Component Value Date   HGB 9.8 (L) 05/21/2022   TIBC 514 (H) 05/21/2022   IRONPCTSAT 9 (L) 05/21/2022   FERRITIN 18 (L) 05/21/2022    This is consistent with iron deficiency anemia. Etiology of iron deficiency, suspect GI blood loss. Recommend GI follow up Aortic stenosis, possible AVM or variced bleeding.   I called patient and wife, and discussed about option of continue oral iron supplementation and repeat blood work for evaluation of treatment response.  If no significant improvement, then proceed with IV Venofer treatments. Alternative option of proceed with IV Venofer treatments. I discussed about the potential risks including but not limited to allergic reactions/infusion reactions including anaphylactic reactions, phlebitis, high blood pressure, wheezing, SOB, skin rash, weight gain,dark urine, leg swelling, back pain, headache, nausea and fatigue, etc. Patient and wife are interested in IV Venofer. Plan IV venofer weekly x 4

## 2022-05-21 NOTE — Assessment & Plan Note (Signed)
LDH is elevated, likely due to increase RBC destruction due to valve stenosis.

## 2022-05-21 NOTE — Progress Notes (Signed)
Hematology/Oncology Progress note [phone] [phone]         Patient Care Team: Mick Sell, MD as PCP - General (Infectious Diseases)  CHIEF COMPLAINTS/REASON FOR VISIT:  anemia  ASSESSMENT & PLAN:   Iron deficiency anemia Normocytic anemia, check CBC, CMP, iron, TIBC ferritin, folate, multiple myeloma panel, light chain ratio, flow cytometry, LDH, haptoglobin, reticulocyte panel, smear.  Original results were reviewed at the time of dictation. Lab Results  Component Value Date   HGB 9.8 (L) 05/21/2022   TIBC 514 (H) 05/21/2022   IRONPCTSAT 9 (L) 05/21/2022   FERRITIN 18 (L) 05/21/2022    This is consistent with iron deficiency anemia. Etiology of iron deficiency, suspect GI blood loss. Recommend GI follow up Aortic stenosis, possible AVM or variced bleeding.   I called patient and wife, and discussed about option of continue oral iron supplementation and repeat blood work for evaluation of treatment response.  If no significant improvement, then proceed with IV Venofer treatments. Alternative option of proceed with IV Venofer treatments. I discussed about the potential risks including but not limited to allergic reactions/infusion reactions including anaphylactic reactions, phlebitis, high blood pressure, wheezing, SOB, skin rash, weight gain,dark urine, leg swelling, back pain, headache, nausea and fatigue, etc. Patient and wife are interested in IV Venofer. Plan IV venofer weekly x 4   Liver cirrhosis (HCC) Follow up with GI  Aortic stenosis LDH is elevated, likely due to increase RBC destruction due to valve stenosis.     Orders Placed This Encounter  Procedures   CBC with Differential/Platelet    Standing Status:   Future    Number of Occurrences:   1    Standing Expiration Date:   05/21/2023   Iron and TIBC    Standing Status:   Future    Number of Occurrences:   1    Standing Expiration Date:   05/21/2023   Ferritin     Standing Status:   Future    Number of Occurrences:   1    Standing Expiration Date:   11/21/2022   Folate    Standing Status:   Future    Number of Occurrences:   1    Standing Expiration Date:   05/21/2023   Multiple Myeloma Panel (SPEP&IFE w/QIG)    Standing Status:   Future    Number of Occurrences:   1    Standing Expiration Date:   05/21/2023   Kappa/lambda light chains    Standing Status:   Future    Number of Occurrences:   1    Standing Expiration Date:   05/21/2023   Flow cytometry panel-leukemia/lymphoma work-up    Standing Status:   Future    Number of Occurrences:   1    Standing Expiration Date:   05/21/2023   Lactate dehydrogenase    Standing Status:   Future    Number of Occurrences:   1    Standing Expiration Date:   05/21/2023   Haptoglobin    Standing Status:   Future    Number of Occurrences:   1    Standing Expiration Date:   05/21/2023   Retic Panel    Standing Status:   Future    Number of Occurrences:   1    Standing Expiration Date:   05/21/2023   Technologist smear review    Standing Status:   Future    Standing Expiration Date:   05/21/2023    Order Specific Question:  Clinical information:    Answer:   anemia   Follow up 2-3 weeks. MD to review results.  All questions were answered. The patient knows to call the clinic with any problems, questions or concerns.  Rickard Patience, MD, PhD [facility] Hematology Oncology 05/21/2022   HISTORY OF PRESENTING ILLNESS:   Christopher Huff is a  67 y.o.  male with PMH listed below was seen in consultation at the request of  Mick Sell, MD  for evaluation of weight loss, anemia, thrombocytopenia  Patient has a history of alcohol dependence, status post cessation in August 2022. 08/16/2020 CT abdomen pelvis with contrast showed which showed cirrhosis with mild is ascites.  Prominent vascularity along the distal aspect of the esophagus suspicious for varices.  duodenitis.   11/06/2020, MRI abdomen with and without  contrast showed cirrhotic hepatic morphology.  Portal hypertension.  Splenomegaly.  No arterially enhancing hepatic lesion.  No pancreatic lesion  He has noted 50 pound weight loss over the past 1 year.  Appetite is fair.  Denies any nausea vomiting diarrhea, coffee-ground emesis, hematemesis abdominal pain, dysphagia. He has CT chest without contrast scheduled for work-up of unintentional weight loss. 11/29/2020, CBC showed mild anemia with a hemoglobin of 13.1, MCV 99.2, platelet count 146,000.  Slight leukopenia with total WBC 3.8, normal differential.  Previous work-up results include Acute hepatitis panel negative.  Normal ferritin level.  Normal thyroid function He has vitamin B12 deficiency, vitamin D level was decreased at 240.  Patient has been on vitamin B-12 injections as well as oral B12 supplements.   Denies night sweats, fever.    INTERVAL HISTORY NEHAMIAH HIMMELSBACH is a 67 y.o. male who has above history reviewed by me today presents for reestablish care for anemia.  Patient was Previously by me for unintentional weight loss, vitamin B12 deficiency, thrombocytopenia. Thrombocytopenia has been resolved Currently he was found to have progressively worsening of anemia level to 9.  Normocytic Denies any bleeding events.  He reports intermittent night sweats sometimes 2-3 times per week.  No fever. He is underweight chronically.  He has not noticed any additional unintentional weight loss recently Fatigue. His appetite is good. Review of Systems  Constitutional:  Positive for unexpected weight change. Negative for appetite change, chills, fatigue and fever.  HENT:   Negative for hearing loss and voice change.   Eyes:  Negative for eye problems and icterus.  Respiratory:  Negative for chest tightness, cough and shortness of breath.   Cardiovascular:  Negative for chest pain and leg swelling.  Gastrointestinal:  Negative for abdominal distention, abdominal pain, constipation and diarrhea.   Endocrine: Negative for hot flashes.  Genitourinary:  Negative for difficulty urinating, dysuria and frequency.   Musculoskeletal:  Negative for arthralgias.  Skin:  Negative for itching and rash.  Neurological:  Negative for light-headedness and numbness.  Hematological:  Negative for adenopathy. Does not bruise/bleed easily.  Psychiatric/Behavioral:  Negative for confusion.     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   [facility]   CLOSED MANIPULATION SHOULDER WITH STERIOD INJECTION Left 10/27/2021   Procedure: Left shoulder arthroscopic lysis of adhesions, capsular release, and manipulation under anesthesia with  corticosteroid injection;  Surgeon: Signa Kell, MD;  Location: [facility];  Service: Orthopedics;  Laterality: Left;  Diabetic   COLONOSCOPY     COLONOSCOPY WITH PROPOFOL N/A 11/11/2020   Procedure: COLONOSCOPY WITH PROPOFOL;  Surgeon: Jaynie Collins, DO;  Location: [facility] ENDOSCOPY;  Service: Gastroenterology;  Laterality: N/A;  DM   ESOPHAGOGASTRODUODENOSCOPY N/A 11/11/2020   Procedure: ESOPHAGOGASTRODUODENOSCOPY (EGD);  Surgeon: Jaynie Collins, DO;  Location: [facility] ENDOSCOPY;  Service: Gastroenterology;  Laterality: N/A;   RIGHT/LEFT HEART CATH AND CORONARY ANGIOGRAPHY N/A 12/08/2020   Procedure: RIGHT/LEFT HEART CATH AND CORONARY ANGIOGRAPHY;  Surgeon: Lamar Blinks, MD;  Location: [facility] INVASIVE CV LAB;  Service: Cardiovascular;  Laterality: N/A;   TONSILLECTOMY      SOCIAL HISTORY: Social History   Socioeconomic History   Marital status: Married    Spouse name: Not on file   Number of children: Not on file   Years of education: Not on file    Highest education level: Not on file  Occupational History   Not on file  Tobacco Use   Smoking status: Never   Smokeless tobacco: Current    Types: Snuff   Tobacco comments:    Quit before surgery, surgery cancelled started back . Working on cessation   Building services engineer Use: Never used  Substance and Sexual Activity   Alcohol use: Yes    Alcohol/week: 1.0 standard drink of alcohol    Types: 1 Cans of beer per week    Comment: stopped 08/2020. 10/24/21 - occasional   Drug use: Never   Sexual activity: Not on file  Other Topics Concern   Not on file  Social History Narrative   Not on file   Social Determinants of Health   Financial Resource Strain: Not on file  Food Insecurity: Not on file  Transportation Needs: Not on file  Physical Activity: Not on file  Stress: Not on file  Social Connections: Not on file  Intimate Partner Violence: Not on file    FAMILY HISTORY: Family History  Problem Relation Age of Onset   Heart attack Father    Leukemia Paternal Uncle     ALLERGIES:  has No Known Allergies.  MEDICATIONS:  Current Outpatient Medications  Medication Sig Dispense Refill   glimepiride (AMARYL) 1 MG tablet Take 4 mg by mouth daily.     insulin degludec (TRESIBA FLEXTOUCH) 100 UNIT/ML FlexTouch Pen Inject 19 Units into the skin daily.     metFORMIN (GLUCOPHAGE) 500 MG tablet Take 1 tablet (500 mg total) by mouth 2 (two) times daily with a meal. 60 tablet 11   omeprazole (PRILOSEC) 20 MG capsule Take 20 mg by mouth daily.     vitamin B-12 (CYANOCOBALAMIN) 1000 MCG tablet Take 1,000 mcg by mouth daily.     No current facility-administered medications for this visit.     PHYSICAL EXAMINATION: ECOG PERFORMANCE STATUS: 1 - Symptomatic but completely ambulatory Vitals:   05/21/22 1005  BP: 135/71  Pulse: 70  Temp: (!) 96.3 F (35.7 C)  SpO2: 100%   Filed Weights   05/21/22 1005  Weight: 123 lb 12.8 oz (56.2 kg)    Physical Exam Constitutional:       General: He is not in acute distress. HENT:     Head: Normocephalic and atraumatic.  Eyes:     General: No scleral icterus. Cardiovascular:     Rate and Rhythm: Normal rate and regular rhythm.     Heart sounds: Normal heart  sounds.  Pulmonary:     Effort: Pulmonary effort is normal. No respiratory distress.     Breath sounds: No wheezing.  Abdominal:     General: Bowel sounds are normal. There is no distension.     Palpations: Abdomen is soft.  Musculoskeletal:        General: No deformity. Normal range of motion.     Cervical back: Normal range of motion and neck supple.  Skin:    General: Skin is warm and dry.     Findings: No erythema or rash.  Neurological:     Mental Status: He is alert and oriented to person, place, and time. Mental status is at baseline.     Cranial Nerves: No cranial nerve deficit.     Coordination: Coordination normal.  Psychiatric:        Mood and Affect: Mood normal.     LABORATORY DATA:  I have reviewed the data as listed Lab Results  Component Value Date   WBC 6.1 05/21/2022   HGB 9.8 (L) 05/21/2022   HCT 32.5 (L) 05/21/2022   MCV 85.3 05/21/2022   PLT 266 05/21/2022   No results for input(s): "NA", "K", "CL", "CO2", "GLUCOSE", "BUN", "CREATININE", "CALCIUM", "GFRNONAA", "GFRAA", "PROT", "ALBUMIN", "AST", "ALT", "ALKPHOS", "BILITOT", "BILIDIR", "IBILI" in the last 8760 hours.  Iron/TIBC/Ferritin/ %Sat    Component Value Date/Time   IRON 46 05/21/2022 1104   TIBC 514 (H) 05/21/2022 1104   FERRITIN 18 (L) 05/21/2022 1104   IRONPCTSAT 9 (L) 05/21/2022 1104      RADIOGRAPHIC STUDIES: I have personally reviewed the radiological images as listed and agreed with the findings in the report. No results found.    ASSESSMENT & PLAN:  1. Thrombocytopenia (HCC)   2. Normocytic anemia    #Chronic macrocytosis MCV is improving since he stops alcohol use Patient has adequate vitamin B12 level.  Negative M protein, normal light chain ratio.   Normal LDH.   Hemoglobin is normal.  Continue monitor.  #Thrombocytopenia, resolved.  Previously low platelet count could be due to alcohol use/previous vitamin B12 deficiency.  Now B12 has normalized. No phenotypic abnormality on peripheral flow cytometry.  Increase CD4/CD8 ratio which is nonspecific.  Elevated alkaline phosphatase.  With elevated GGT.  This is likely due to liver cirrhosis. . Unintentional weight loss Previous CT, chest abdomen pelvis/MRI did not show radiographic evidence of mass.  Results were reviewed with patient. I feel that the weight loss could be secondary to cirrhosis, diabetes and medication side effects.  His chronic intermittent diarrhea could be also secondary to  metformin use.  Recommend patient continue follow-up with primary care provider. Monitor for symptoms.   Orders Placed This Encounter  Procedures   CBC with Differential/Platelet    Standing Status:   Future    Number of Occurrences:   1    Standing Expiration Date:   05/21/2023   Iron and TIBC    Standing Status:   Future    Number of Occurrences:   1    Standing Expiration Date:   05/21/2023   Ferritin    Standing Status:   Future    Number of Occurrences:   1    Standing Expiration Date:   11/21/2022   Folate    Standing Status:   Future    Number of Occurrences:   1    Standing Expiration Date:   05/21/2023   Multiple Myeloma Panel (SPEP&IFE w/QIG)    Standing Status:   Future  Number of Occurrences:   1    Standing Expiration Date:   05/21/2023   Kappa/lambda light chains    Standing Status:   Future    Number of Occurrences:   1    Standing Expiration Date:   05/21/2023   Flow cytometry panel-leukemia/lymphoma work-up    Standing Status:   Future    Number of Occurrences:   1    Standing Expiration Date:   05/21/2023   Lactate dehydrogenase    Standing Status:   Future    Number of Occurrences:   1    Standing Expiration Date:   05/21/2023   Haptoglobin    Standing Status:    Future    Number of Occurrences:   1    Standing Expiration Date:   05/21/2023   Retic Panel    Standing Status:   Future    Number of Occurrences:   1    Standing Expiration Date:   05/21/2023   Technologist smear review    Standing Status:   Future    Standing Expiration Date:   05/21/2023    Order Specific Question:   Clinical information:    Answer:   anemia    All questions were answered. The patient knows to call the clinic with any problems questions or concerns.  cc Mick Sell, MD

## 2022-05-22 LAB — KAPPA/LAMBDA LIGHT CHAINS
Kappa free light chain: 43.5 mg/L — ABNORMAL HIGH (ref 3.3–19.4)
Kappa, lambda light chain ratio: 1.44 (ref 0.26–1.65)
Lambda free light chains: 30.3 mg/L — ABNORMAL HIGH (ref 5.7–26.3)

## 2022-05-22 LAB — HAPTOGLOBIN: Haptoglobin: <10 mg/dL — ABNORMAL LOW (ref 32–363)

## 2022-05-23 LAB — COMP PANEL: LEUKEMIA/LYMPHOMA

## 2022-05-24 ENCOUNTER — Inpatient Hospital Stay: Payer: Medicare HMO

## 2022-05-24 VITALS — BP 121/71 | HR 65 | Temp 99.2°F

## 2022-05-24 DIAGNOSIS — D509 Iron deficiency anemia, unspecified: Secondary | ICD-10-CM | POA: Diagnosis not present

## 2022-05-24 MED ORDER — SODIUM CHLORIDE 0.9 % IV SOLN
Freq: Once | INTRAVENOUS | Status: AC
  Filled 2022-05-24: qty 250

## 2022-05-24 MED ORDER — SODIUM CHLORIDE 0.9 % IV SOLN
200.0000 mg | Freq: Once | INTRAVENOUS | Status: AC
  Administered 2022-05-24: 200 mg via INTRAVENOUS
  Filled 2022-05-24: qty 200

## 2022-05-24 NOTE — Patient Instructions (Signed)

## 2022-05-29 LAB — MULTIPLE MYELOMA PANEL, SERUM
Albumin SerPl Elph-Mcnc: 3.5 g/dL (ref 2.9–4.4)
Albumin/Glob SerPl: 0.9 (ref 0.7–1.7)
Alpha 1: 0.3 g/dL (ref 0.0–0.4)
Alpha2 Glob SerPl Elph-Mcnc: 0.5 g/dL (ref 0.4–1.0)
B-Globulin SerPl Elph-Mcnc: 1.2 g/dL (ref 0.7–1.3)
Gamma Glob SerPl Elph-Mcnc: 1.9 g/dL — ABNORMAL HIGH (ref 0.4–1.8)
Globulin, Total: 3.9 g/dL (ref 2.2–3.9)
IgA: 513 mg/dL — ABNORMAL HIGH (ref 61–437)
IgG (Immunoglobin G), Serum: 1902 mg/dL — ABNORMAL HIGH (ref 603–1613)
IgM (Immunoglobulin M), Srm: 95 mg/dL (ref 20–172)
Total Protein ELP: 7.4 g/dL (ref 6.0–8.5)

## 2022-05-30 ENCOUNTER — Inpatient Hospital Stay: Payer: Medicare HMO

## 2022-05-30 VITALS — BP 140/66 | HR 71 | Temp 99.0°F | Resp 16

## 2022-05-30 DIAGNOSIS — D509 Iron deficiency anemia, unspecified: Secondary | ICD-10-CM | POA: Diagnosis not present

## 2022-05-30 MED ORDER — SODIUM CHLORIDE 0.9 % IV SOLN
Freq: Once | INTRAVENOUS | Status: AC
  Filled 2022-05-30: qty 250

## 2022-05-30 MED ORDER — SODIUM CHLORIDE 0.9 % IV SOLN
200.0000 mg | Freq: Once | INTRAVENOUS | Status: AC
  Administered 2022-05-30: 200 mg via INTRAVENOUS
  Filled 2022-05-30: qty 200

## 2022-05-30 NOTE — Progress Notes (Signed)
Patient declined to wait the 30 minutes for post iron infusion observation today. Tolerated infusion well. VSS. 

## 2022-05-31 ENCOUNTER — Inpatient Hospital Stay: Payer: Medicare HMO

## 2022-06-01 ENCOUNTER — Inpatient Hospital Stay: Payer: Medicare HMO

## 2022-06-01 VITALS — BP 124/80 | HR 73 | Temp 98.6°F

## 2022-06-01 DIAGNOSIS — D509 Iron deficiency anemia, unspecified: Secondary | ICD-10-CM | POA: Diagnosis not present

## 2022-06-01 MED ORDER — SODIUM CHLORIDE 0.9 % IV SOLN
Freq: Once | INTRAVENOUS | Status: AC
  Filled 2022-06-01: qty 250

## 2022-06-01 MED ORDER — SODIUM CHLORIDE 0.9 % IV SOLN
200.0000 mg | Freq: Once | INTRAVENOUS | Status: AC
  Administered 2022-06-01: 200 mg via INTRAVENOUS
  Filled 2022-06-01: qty 200

## 2022-06-05 ENCOUNTER — Ambulatory Visit: Payer: Medicare HMO | Admitting: Oncology

## 2022-06-07 ENCOUNTER — Inpatient Hospital Stay: Payer: Medicare HMO

## 2022-06-13 ENCOUNTER — Inpatient Hospital Stay: Payer: Medicare HMO

## 2022-06-13 ENCOUNTER — Inpatient Hospital Stay: Payer: Medicare HMO | Attending: Oncology | Admitting: Oncology

## 2022-06-13 ENCOUNTER — Encounter: Payer: Self-pay | Admitting: Oncology

## 2022-06-13 ENCOUNTER — Ambulatory Visit: Payer: Medicare HMO | Admitting: Oncology

## 2022-06-13 ENCOUNTER — Telehealth: Payer: Self-pay

## 2022-06-13 VITALS — BP 111/61 | HR 65 | Temp 96.1°F | Resp 18 | Wt 119.5 lb

## 2022-06-13 VITALS — BP 123/64 | HR 68

## 2022-06-13 DIAGNOSIS — D500 Iron deficiency anemia secondary to blood loss (chronic): Secondary | ICD-10-CM

## 2022-06-13 DIAGNOSIS — K7030 Alcoholic cirrhosis of liver without ascites: Secondary | ICD-10-CM

## 2022-06-13 DIAGNOSIS — D509 Iron deficiency anemia, unspecified: Secondary | ICD-10-CM | POA: Insufficient documentation

## 2022-06-13 DIAGNOSIS — I350 Nonrheumatic aortic (valve) stenosis: Secondary | ICD-10-CM

## 2022-06-13 LAB — CBC WITH DIFFERENTIAL (CANCER CENTER ONLY)
Abs Immature Granulocytes: 0.03 10*3/uL (ref 0.00–0.07)
Basophils Absolute: 0.1 10*3/uL (ref 0.0–0.1)
Basophils Relative: 1 %
Eosinophils Absolute: 0.6 10*3/uL — ABNORMAL HIGH (ref 0.0–0.5)
Eosinophils Relative: 9 %
HCT: 32.9 % — ABNORMAL LOW (ref 39.0–52.0)
Hemoglobin: 10.2 g/dL — ABNORMAL LOW (ref 13.0–17.0)
Immature Granulocytes: 0 %
Lymphocytes Relative: 10 %
Lymphs Abs: 0.7 10*3/uL (ref 0.7–4.0)
MCH: 28.3 pg (ref 26.0–34.0)
MCHC: 31 g/dL (ref 30.0–36.0)
MCV: 91.4 fL (ref 80.0–100.0)
Monocytes Absolute: 0.7 10*3/uL (ref 0.1–1.0)
Monocytes Relative: 10 %
Neutro Abs: 4.9 10*3/uL (ref 1.7–7.7)
Neutrophils Relative %: 70 %
Platelet Count: 219 10*3/uL (ref 150–400)
RBC: 3.6 MIL/uL — ABNORMAL LOW (ref 4.22–5.81)
RDW: 21.6 % — ABNORMAL HIGH (ref 11.5–15.5)
WBC Count: 7 10*3/uL (ref 4.0–10.5)
nRBC: 0 % (ref 0.0–0.2)

## 2022-06-13 LAB — RETIC PANEL
Immature Retic Fract: 11.2 % (ref 2.3–15.9)
RBC.: 3.64 MIL/uL — ABNORMAL LOW (ref 4.22–5.81)
Retic Count, Absolute: 99 10*3/uL (ref 19.0–186.0)
Retic Ct Pct: 2.7 % (ref 0.4–3.1)
Reticulocyte Hemoglobin: 33.1 pg (ref >27.9)

## 2022-06-13 MED ORDER — SODIUM CHLORIDE 0.9 % IV SOLN
Freq: Once | INTRAVENOUS | Status: AC
  Filled 2022-06-13: qty 250

## 2022-06-13 MED ORDER — SODIUM CHLORIDE 0.9 % IV SOLN
200.0000 mg | Freq: Once | INTRAVENOUS | Status: AC
  Administered 2022-06-13: 200 mg via INTRAVENOUS
  Filled 2022-06-13: qty 200

## 2022-06-13 NOTE — Assessment & Plan Note (Addendum)
Labs are reviewed and discussed with patient.  Lab Results  Component Value Date   HGB 10.2 (L) 06/13/2022   TIBC 514 (H) 05/21/2022   IRONPCTSAT 9 (L) 05/21/2022   FERRITIN 18 (L) 05/21/2022    He tolerated IV Venofer. He will get 4th dose of Venofer today. Hb has improved.  Will add another dose of Venofer in 1 -2 weeks, total 5 doses.

## 2022-06-13 NOTE — Progress Notes (Signed)
Patient declined to wait the 30 minutes for post iron infusion observation today. Tolerated infusion well. VSS. 

## 2022-06-13 NOTE — Progress Notes (Signed)
Hematology/Oncology Progress note [phone] [phone]         Patient Care Team: Mick Sell, MD as PCP - General (Infectious Diseases)  CHIEF COMPLAINTS/REASON FOR VISIT:  anemia  ASSESSMENT & PLAN:   Iron deficiency anemia Labs are reviewed and discussed with patient.  Lab Results  Component Value Date   HGB 10.2 (L) 06/13/2022   TIBC 514 (H) 05/21/2022   IRONPCTSAT 9 (L) 05/21/2022   FERRITIN 18 (L) 05/21/2022    He tolerated IV Venofer. He will get 4th dose of Venofer today. Hb has improved.  Will add another dose of Venofer in 1 -2 weeks, total 5 doses.    Aortic stenosis LDH is elevated,decreased haptoglobin.  likely due to increase RBC destruction due to valve stenosis.    Liver cirrhosis (HCC) Follow up with GI    Orders Placed This Encounter  Procedures   CBC with Differential (Cancer Center Only)    Standing Status:   Future    Number of Occurrences:   1    Standing Expiration Date:   06/13/2023   Retic Panel    Standing Status:   Future    Number of Occurrences:   1    Standing Expiration Date:   06/13/2023   CBC with Differential (Cancer Center Only)    Standing Status:   Future    Standing Expiration Date:   06/13/2023   Iron and TIBC    Standing Status:   Future    Standing Expiration Date:   06/13/2023   Ferritin    Standing Status:   Future    Standing Expiration Date:   06/13/2023   Retic Panel    Standing Status:   Future    Standing Expiration Date:   06/13/2023   Hepatic function panel    Standing Status:   Future    Standing Expiration Date:   06/13/2023   Flow cytometry panel-leukemia/lymphoma work-up    Standing Status:   Future    Standing Expiration Date:   06/13/2023   Follow up 3 months lab MD Venofer.   All questions were answered. The patient knows to call the clinic with any problems, questions or concerns.  Rickard Patience, MD, PhD [facility] Hematology Oncology 06/13/2022   HISTORY OF  PRESENTING ILLNESS:   Christopher Huff is a  67 y.o.  male with PMH listed below was seen in consultation at the request of  Mick Sell, MD  for evaluation of weight loss, anemia, thrombocytopenia  Patient has a history of alcohol dependence, status post cessation in August 2022. 08/16/2020 CT abdomen pelvis with contrast showed which showed cirrhosis with mild is ascites.  Prominent vascularity along the distal aspect of the esophagus suspicious for varices.  duodenitis.   11/06/2020, MRI abdomen with and without contrast showed cirrhotic hepatic morphology.  Portal hypertension.  Splenomegaly.  No arterially enhancing hepatic lesion.  No pancreatic lesion  He has noted 50 pound weight loss over the past 1 year.  Appetite is fair.  Denies any nausea vomiting diarrhea, coffee-ground emesis, hematemesis abdominal pain, dysphagia. He has CT chest without contrast scheduled for work-up of unintentional weight loss. 11/29/2020, CBC showed mild anemia with a hemoglobin of 13.1, MCV 99.2, platelet count 146,000.  Slight leukopenia with total WBC 3.8, normal differential.  Previous work-up results include Acute hepatitis panel negative.  Normal ferritin level.  Normal thyroid function He has vitamin B12 deficiency, vitamin D level was decreased at 240.  Patient  has been on vitamin B-12 injections as well as oral B12 supplements.   Denies night sweats, fever.    INTERVAL HISTORY Christopher Huff is a 67 y.o. male who has above history reviewed by me today presents for reestablish care for anemia.   He was found to have IDA, has started on venofer treatments. S/p 3 treatments. He tolerated well. Some improvement of fatigue  He has upcoming appt with GI in August 2024  Appetite is good. Weight is stable.    Review of Systems  Constitutional:  Positive for fatigue and unexpected weight change. Negative for appetite change, chills and fever.  HENT:   Negative for hearing loss and voice change.   Eyes:   Negative for eye problems and icterus.  Respiratory:  Negative for chest tightness, cough and shortness of breath.   Cardiovascular:  Negative for chest pain and leg swelling.  Gastrointestinal:  Negative for abdominal distention, abdominal pain, constipation and diarrhea.  Endocrine: Negative for hot flashes.  Genitourinary:  Negative for difficulty urinating, dysuria and frequency.   Musculoskeletal:  Negative for arthralgias.  Skin:  Negative for itching and rash.  Neurological:  Negative for light-headedness and numbness.  Hematological:  Negative for adenopathy. Does not bruise/bleed easily.  Psychiatric/Behavioral:  Negative for confusion.     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   [facility]   CLOSED MANIPULATION SHOULDER WITH STERIOD INJECTION Left 10/27/2021   Procedure: Left shoulder arthroscopic lysis of adhesions, capsular release, and manipulation under anesthesia with corticosteroid injection;  Surgeon: Signa Kell, MD;  Location: [facility];  Service: Orthopedics;  Laterality: Left;  Diabetic   COLONOSCOPY     COLONOSCOPY WITH PROPOFOL N/A 11/11/2020   Procedure: COLONOSCOPY WITH PROPOFOL;  Surgeon: Jaynie Collins, DO;  Location: [facility];  Service: Gastroenterology;  Laterality: N/A;  DM   ESOPHAGOGASTRODUODENOSCOPY N/A 11/11/2020   Procedure: ESOPHAGOGASTRODUODENOSCOPY (EGD);  Surgeon: Jaynie Collins, DO;  Location: [facility] ENDOSCOPY;  Service: Gastroenterology;  Laterality: N/A;   RIGHT/LEFT HEART CATH AND CORONARY ANGIOGRAPHY N/A 12/08/2020   Procedure: RIGHT/LEFT HEART CATH AND CORONARY ANGIOGRAPHY;  Surgeon: Lamar Blinks, MD;  Location:  [facility];  Service: Cardiovascular;  Laterality: N/A;   TONSILLECTOMY      SOCIAL HISTORY: Social History   Socioeconomic History   Marital status: Married    Spouse name: Not on file   Number of children: Not on file   Years of education: Not on file   Highest education level: Not on file  Occupational History   Not on file  Tobacco Use   Smoking status: Never   Smokeless tobacco: Current    Types: Snuff   Tobacco comments:    Quit before surgery, surgery cancelled started back . Working on cessation   Building services engineer Use: Never used  Substance and Sexual Activity   Alcohol use: Yes    Alcohol/week: 1.0 standard drink of alcohol    Types: 1 Cans of beer per week    Comment: stopped 08/2020. 10/24/21 - occasional   Drug use: Never   Sexual activity: Not on file  Other Topics Concern   Not on file  Social History Narrative   Not on file   Social Determinants of Health   Financial Resource Strain: Not on file  Food Insecurity: Not on file  Transportation Needs: Not on file  Physical Activity: Not on file  Stress: Not on file  Social Connections: Not on file  Intimate Partner Violence: Not on file    FAMILY HISTORY: Family History  Problem Relation Age of Onset   Heart attack Father    Leukemia Paternal Uncle     ALLERGIES:  has No Known Allergies.  MEDICATIONS:  Current Outpatient Medications  Medication Sig Dispense Refill   glimepiride (AMARYL) 1 MG tablet Take 4 mg by mouth daily.     insulin degludec (TRESIBA FLEXTOUCH) 100 UNIT/ML FlexTouch Pen Inject 19 Units into the skin daily.     metFORMIN (GLUCOPHAGE) 500 MG tablet Take 1 tablet (500 mg total) by mouth 2 (two) times daily with a meal. 60 tablet 11   omeprazole (PRILOSEC) 20 MG capsule Take 20 mg by mouth daily.     vitamin B-12 (CYANOCOBALAMIN) 1000 MCG tablet Take 1,000 mcg by mouth daily.     No current facility-administered medications for this visit.     PHYSICAL  EXAMINATION: ECOG PERFORMANCE STATUS: 1 - Symptomatic but completely ambulatory Vitals:   06/13/22 0957  BP: 111/61  Pulse: 65  Resp: 18  Temp: (!) 96.1 F (35.6 C)  SpO2: 100%   Filed Weights   06/13/22 0957  Weight: 119 lb 8 oz (54.2 kg)    Physical Exam Constitutional:      General: He is not in acute distress. HENT:     Head: Normocephalic and atraumatic.  Eyes:     General: No scleral icterus. Cardiovascular:     Rate and Rhythm: Normal rate and regular rhythm.     Heart sounds: Normal heart sounds.  Pulmonary:     Effort: Pulmonary effort is normal. No respiratory distress.     Breath sounds: No wheezing.  Abdominal:     General: Bowel sounds are normal. There is no distension.     Palpations: Abdomen is soft.  Musculoskeletal:        General: No deformity. Normal range of motion.     Cervical back: Normal range of motion and neck supple.  Skin:    General: Skin is warm and dry.     Findings: No erythema or rash.  Neurological:     Mental Status: He is alert and oriented to person, place, and time. Mental status is at baseline.     Cranial Nerves: No cranial nerve deficit.     Coordination: Coordination normal.  Psychiatric:        Mood and Affect: Mood normal.     LABORATORY DATA:  I have reviewed the data as listed Lab Results  Component Value Date   WBC 7.0 06/13/2022   HGB 10.2 (L) 06/13/2022   HCT 32.9 (L) 06/13/2022   MCV 91.4 06/13/2022   PLT 219 06/13/2022   No results for input(s): "NA", "K", "CL", "CO2", "GLUCOSE", "BUN", "CREATININE", "CALCIUM", "GFRNONAA", "GFRAA", "PROT", "ALBUMIN", "AST", "ALT", "ALKPHOS", "BILITOT", "BILIDIR", "IBILI" in the last 8760 hours.  Iron/TIBC/Ferritin/ %Sat    Component Value Date/Time   IRON 46 05/21/2022 1104   TIBC 514 (H) 05/21/2022 1104   FERRITIN 18 (L) 05/21/2022 1104   IRONPCTSAT 9 (L) 05/21/2022 1104      RADIOGRAPHIC STUDIES: I have personally reviewed the radiological images as listed and  agreed with the findings in  the report. No results found.

## 2022-06-13 NOTE — Telephone Encounter (Signed)
Dr. Cathie Hoops has communicated plan for venofer with patient via Mychart.   Please schedule venofer x1 within 1 - 2 weeks and inform pt of appt details.

## 2022-06-13 NOTE — Assessment & Plan Note (Signed)
Follow-up with GI 

## 2022-06-13 NOTE — Assessment & Plan Note (Addendum)
LDH is elevated,decreased haptoglobin.  likely due to increase RBC destruction due to valve stenosis.

## 2022-06-13 NOTE — Telephone Encounter (Signed)
-----   Message from Rickard Patience, MD sent at 06/13/2022  3:29 PM EDT ----- Mychart message sent.  Please arrange him to do one Venofer dose in 1-2 weeks. Thanks.  zy

## 2022-06-14 ENCOUNTER — Inpatient Hospital Stay: Payer: Medicare HMO

## 2022-06-14 ENCOUNTER — Encounter: Payer: Self-pay | Admitting: Oncology

## 2022-06-22 ENCOUNTER — Inpatient Hospital Stay: Payer: Medicare HMO

## 2022-06-22 VITALS — BP 118/65 | HR 67 | Temp 97.2°F

## 2022-06-22 DIAGNOSIS — D509 Iron deficiency anemia, unspecified: Secondary | ICD-10-CM | POA: Diagnosis not present

## 2022-06-22 MED ORDER — SODIUM CHLORIDE 0.9 % IV SOLN
Freq: Once | INTRAVENOUS | Status: AC
  Filled 2022-06-22: qty 250

## 2022-06-22 MED ORDER — SODIUM CHLORIDE 0.9 % IV SOLN
200.0000 mg | Freq: Once | INTRAVENOUS | Status: AC
  Administered 2022-06-22: 200 mg via INTRAVENOUS
  Filled 2022-06-22: qty 200

## 2022-06-22 NOTE — Patient Instructions (Signed)

## 2022-07-24 ENCOUNTER — Ambulatory Visit
Admission: RE | Admit: 2022-07-24 | Discharge: 2022-07-24 | Disposition: A | Discharge status: Home / Self Care | Payer: Medicare HMO | Source: Ambulatory Visit | Attending: Gastroenterology | Admitting: Gastroenterology

## 2022-07-24 DIAGNOSIS — K7460 Unspecified cirrhosis of liver: Secondary | ICD-10-CM | POA: Insufficient documentation

## 2022-08-14 ENCOUNTER — Other Ambulatory Visit: Payer: Self-pay | Admitting: Gastroenterology

## 2022-08-14 DIAGNOSIS — K7460 Unspecified cirrhosis of liver: Secondary | ICD-10-CM

## 2022-09-11 ENCOUNTER — Inpatient Hospital Stay: Payer: Medicare HMO | Attending: Oncology

## 2022-09-11 DIAGNOSIS — Z23 Encounter for immunization: Secondary | ICD-10-CM | POA: Insufficient documentation

## 2022-09-11 DIAGNOSIS — Z79899 Other long term (current) drug therapy: Secondary | ICD-10-CM | POA: Insufficient documentation

## 2022-09-11 DIAGNOSIS — D500 Iron deficiency anemia secondary to blood loss (chronic): Secondary | ICD-10-CM

## 2022-09-11 DIAGNOSIS — D509 Iron deficiency anemia, unspecified: Secondary | ICD-10-CM | POA: Diagnosis present

## 2022-09-11 DIAGNOSIS — D696 Thrombocytopenia, unspecified: Secondary | ICD-10-CM

## 2022-09-11 LAB — CBC WITH DIFFERENTIAL (CANCER CENTER ONLY)
Abs Immature Granulocytes: 0.05 10*3/uL (ref 0.00–0.07)
Basophils Absolute: 0.1 10*3/uL (ref 0.0–0.1)
Basophils Relative: 2 %
Eosinophils Absolute: 0.2 10*3/uL (ref 0.0–0.5)
Eosinophils Relative: 4 %
HCT: 36 % — ABNORMAL LOW (ref 39.0–52.0)
Hemoglobin: 11.7 g/dL — ABNORMAL LOW (ref 13.0–17.0)
Immature Granulocytes: 1 %
Lymphocytes Relative: 23 %
Lymphs Abs: 0.9 10*3/uL (ref 0.7–4.0)
MCH: 31.5 pg (ref 26.0–34.0)
MCHC: 32.5 g/dL (ref 30.0–36.0)
MCV: 97 fL (ref 80.0–100.0)
Monocytes Absolute: 0.5 10*3/uL (ref 0.1–1.0)
Monocytes Relative: 12 %
Neutro Abs: 2.4 10*3/uL (ref 1.7–7.7)
Neutrophils Relative %: 58 %
Platelet Count: 161 10*3/uL (ref 150–400)
RBC: 3.71 MIL/uL — ABNORMAL LOW (ref 4.22–5.81)
RDW: 14.5 % (ref 11.5–15.5)
WBC Count: 4 10*3/uL (ref 4.0–10.5)
nRBC: 0 % (ref 0.0–0.2)

## 2022-09-11 LAB — TECHNOLOGIST SMEAR REVIEW: Plt Morphology: ADEQUATE

## 2022-09-11 LAB — RETIC PANEL
Immature Retic Fract: 6.3 % (ref 2.3–15.9)
RBC.: 3.69 MIL/uL — ABNORMAL LOW (ref 4.22–5.81)
Retic Count, Absolute: 61.6 10*3/uL (ref 19.0–186.0)
Retic Ct Pct: 1.7 % (ref 0.4–3.1)
Reticulocyte Hemoglobin: 33.2 pg (ref >27.9)

## 2022-09-11 LAB — HEPATIC FUNCTION PANEL
ALT: 21 U/L (ref 0–44)
AST: 29 U/L (ref 15–41)
Albumin: 4.2 g/dL (ref 3.5–5.0)
Alkaline Phosphatase: 131 U/L — ABNORMAL HIGH (ref 38–126)
Bilirubin, Direct: 0.2 mg/dL (ref 0.0–0.2)
Indirect Bilirubin: 0.5 mg/dL (ref 0.3–0.9)
Total Bilirubin: 0.7 mg/dL (ref 0.3–1.2)
Total Protein: 7.3 g/dL (ref 6.5–8.1)

## 2022-09-11 LAB — IRON AND TIBC
Iron: 54 ug/dL (ref 45–182)
Saturation Ratios: 14 % — ABNORMAL LOW (ref 17.9–39.5)
TIBC: 389 ug/dL (ref 250–450)
UIBC: 335 ug/dL

## 2022-09-11 LAB — FERRITIN: Ferritin: 18 ng/mL — ABNORMAL LOW (ref 24–336)

## 2022-09-12 ENCOUNTER — Encounter: Payer: Self-pay | Admitting: Oncology

## 2022-09-12 ENCOUNTER — Inpatient Hospital Stay: Payer: Medicare HMO

## 2022-09-12 ENCOUNTER — Inpatient Hospital Stay (HOSPITAL_BASED_OUTPATIENT_CLINIC_OR_DEPARTMENT_OTHER): Payer: Medicare HMO | Admitting: Oncology

## 2022-09-12 VITALS — BP 118/78 | HR 76 | Temp 97.1°F | Resp 18 | Wt 126.6 lb

## 2022-09-12 VITALS — BP 129/68 | HR 69

## 2022-09-12 DIAGNOSIS — D500 Iron deficiency anemia secondary to blood loss (chronic): Secondary | ICD-10-CM

## 2022-09-12 DIAGNOSIS — K7030 Alcoholic cirrhosis of liver without ascites: Secondary | ICD-10-CM

## 2022-09-12 DIAGNOSIS — D509 Iron deficiency anemia, unspecified: Secondary | ICD-10-CM

## 2022-09-12 DIAGNOSIS — I350 Nonrheumatic aortic (valve) stenosis: Secondary | ICD-10-CM

## 2022-09-12 MED ORDER — SODIUM CHLORIDE 0.9 % IV SOLN
Freq: Once | INTRAVENOUS | Status: AC
  Filled 2022-09-12: qty 250

## 2022-09-12 MED ORDER — IRON-VITAMIN C 65-125 MG PO TABS: 1.0000 | ORAL_TABLET | Freq: Every day | ORAL | 1 refills | Status: DC

## 2022-09-12 MED ORDER — SODIUM CHLORIDE 0.9 % IV SOLN
200.0000 mg | Freq: Once | INTRAVENOUS | Status: AC
  Administered 2022-09-12: 200 mg via INTRAVENOUS
  Filled 2022-09-12: qty 200

## 2022-09-12 NOTE — Assessment & Plan Note (Addendum)
Labs are reviewed and discussed with patient.  Lab Results  Component Value Date   HGB 11.7 (L) 09/11/2022   TIBC 389 09/11/2022   IRONPCTSAT 14 (L) 09/11/2022   FERRITIN 18 (L) 09/11/2022    Recommend Venofer weekly x 3 After that recommend vitron c 1 tablet daily

## 2022-09-12 NOTE — Assessment & Plan Note (Signed)
Follow up with GI for [facility] surveillance, Endoscopy surveillance.

## 2022-09-12 NOTE — Patient Instructions (Signed)
Iron Sucrose Injection What is this medication? IRON SUCROSE (EYE ern SOO krose) treats low levels of iron (iron deficiency anemia) in people with kidney disease. Iron is a mineral that plays an important role in making red blood cells, which carry oxygen from your lungs to the rest of your body. This medicine may be used for other purposes; ask your health care provider or pharmacist if you have questions. COMMON BRAND NAME(S): Venofer What should I tell my care team before I take this medication? They need to know if you have any of these conditions: Anemia not caused by low iron levels Heart disease High levels of iron in the blood Kidney disease Liver disease An unusual or allergic reaction to iron, other medications, foods, dyes, or preservatives Pregnant or trying to get pregnant Breastfeeding How should I use this medication? This medication is for infusion into a vein. It is given in a hospital or clinic setting. Talk to your care team about the use of this medication in children. While this medication may be prescribed for children as young as 2 years for selected conditions, precautions do apply. Overdosage: If you think you have taken too much of this medicine contact a poison control center or emergency room at once. NOTE: This medicine is only for you. Do not share this medicine with others. What if I miss a dose? Keep appointments for follow-up doses. It is important not to miss your dose. Call your care team if you are unable to keep an appointment. What may interact with this medication? Do not take this medication with any of the following: Deferoxamine Dimercaprol Other iron products This medication may also interact with the following: Chloramphenicol Deferasirox This list may not describe all possible interactions. Give your health care provider a list of all the medicines, herbs, non-prescription drugs, or dietary supplements you use. Also tell them if you smoke,  drink alcohol, or use illegal drugs. Some items may interact with your medicine. What should I watch for while using this medication? Visit your care team regularly. Tell your care team if your symptoms do not start to get better or if they get worse. You may need blood work done while you are taking this medication. You may need to follow a special diet. Talk to your care team. Foods that contain iron include: whole grains/cereals, dried fruits, beans, or peas, leafy green vegetables, and organ meats (liver, kidney). What side effects may I notice from receiving this medication? Side effects that you should report to your care team as soon as possible: Allergic reactions--skin rash, itching, hives, swelling of the face, lips, tongue, or throat Low blood pressure--dizziness, feeling faint or lightheaded, blurry vision Shortness of breath Side effects that usually do not require medical attention (report to your care team if they continue or are bothersome): Flushing Headache Joint pain Muscle pain Nausea Pain, redness, or irritation at injection site This list may not describe all possible side effects. Call your doctor for medical advice about side effects. You may report side effects to FDA at 1-800-FDA-1088. Where should I keep my medication? This medication is given in a hospital or clinic. It will not be stored at home. NOTE: This sheet is a summary. It may not cover all possible information. If you have questions about this medicine, talk to your doctor, pharmacist, or health care provider.  2024 Elsevier/Gold Standard (2022-05-25 00:00:00)

## 2022-09-12 NOTE — Assessment & Plan Note (Signed)
LDH is elevated,decreased haptoglobin.  likely due to increase RBC destruction due to valve stenosis.

## 2022-09-12 NOTE — Progress Notes (Signed)
Hematology/Oncology Progress note [phone] [phone]         Patient Care Team: Mick Sell, MD as PCP - General (Infectious Diseases)  CHIEF COMPLAINTS/REASON FOR VISIT:  anemia  ASSESSMENT & PLAN:   Iron deficiency anemia Labs are reviewed and discussed with patient.  Lab Results  Component Value Date   HGB 11.7 (L) 09/11/2022   TIBC 389 09/11/2022   IRONPCTSAT 14 (L) 09/11/2022   FERRITIN 18 (L) 09/11/2022    Recommend Venofer weekly x 3 After that recommend vitron c 1 tablet daily   Liver cirrhosis (HCC) Follow up with GI for [facility] surveillance, Endoscopy surveillance.   Aortic stenosis LDH is elevated,decreased haptoglobin.  likely due to increase RBC destruction due to valve stenosis.      Orders Placed This Encounter  Procedures   CBC (Cancer Center Only)    Standing Status:   Future    Standing Expiration Date:   09/12/2023   Iron and TIBC    Standing Status:   Future    Standing Expiration Date:   09/12/2023   Ferritin    Standing Status:   Future    Standing Expiration Date:   09/12/2023   Follow up 3 months lab MD Venofer.   All questions were answered. The patient knows to call the clinic with any problems, questions or concerns.  Rickard Patience, MD, PhD [facility] Hematology Oncology 09/12/2022   HISTORY OF PRESENTING ILLNESS:   Christopher Huff is a  67 y.o.  male with PMH listed below was seen in consultation at the request of  Mick Sell, MD  for evaluation of weight loss, anemia, thrombocytopenia  Patient has a history of alcohol dependence, status post cessation in August 2022. 08/16/2020 CT abdomen pelvis with contrast showed which showed cirrhosis with mild is ascites.  Prominent vascularity along the distal aspect of the esophagus suspicious for varices.  duodenitis.   11/06/2020, MRI abdomen with and without contrast showed cirrhotic hepatic morphology.  Portal hypertension.  Splenomegaly.  No arterially  enhancing hepatic lesion.  No pancreatic lesion  He has noted 50 pound weight loss over the past 1 year.  Appetite is fair.  Denies any nausea vomiting diarrhea, coffee-ground emesis, hematemesis abdominal pain, dysphagia. He has CT chest without contrast scheduled for work-up of unintentional weight loss. 11/29/2020, CBC showed mild anemia with a hemoglobin of 13.1, MCV 99.2, platelet count 146,000.  Slight leukopenia with total WBC 3.8, normal differential.  Previous work-up results include Acute hepatitis panel negative.  Normal ferritin level.  Normal thyroid function He has vitamin B12 deficiency, vitamin D level was decreased at 240.  Patient has been on vitamin B-12 injections as well as oral B12 supplements.   Denies night sweats, fever.    INTERVAL HISTORY Christopher Huff is a 67 y.o. male who has above history reviewed by me today presents for reestablish care for IDA.  He tolerated iron deficiency anemia.  He had appt with GI in August 2024  Appetite is good. Weight is stable. Fatigue has improved.    Review of Systems  Constitutional:  Positive for fatigue. Negative for appetite change, chills, fever and unexpected weight change.  HENT:   Negative for hearing loss and voice change.   Eyes:  Negative for eye problems and icterus.  Respiratory:  Negative for chest tightness, cough and shortness of breath.   Cardiovascular:  Negative for chest pain and leg swelling.  Gastrointestinal:  Negative for abdominal distention, abdominal  pain, constipation and diarrhea.  Endocrine: Negative for hot flashes.  Genitourinary:  Negative for difficulty urinating, dysuria and frequency.   Musculoskeletal:  Negative for arthralgias.  Skin:  Negative for itching and rash.  Neurological:  Negative for light-headedness and numbness.  Hematological:  Negative for adenopathy. Does not bruise/bleed easily.  Psychiatric/Behavioral:  Negative for confusion.     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   [facility]   CLOSED MANIPULATION SHOULDER WITH STERIOD INJECTION Left 10/27/2021   Procedure: Left shoulder arthroscopic lysis of adhesions, capsular release, and manipulation under anesthesia with corticosteroid injection;  Surgeon: Signa Kell, MD;  Location: [facility];  Service: Orthopedics;  Laterality: Left;  Diabetic   COLONOSCOPY     COLONOSCOPY WITH PROPOFOL N/A 11/11/2020   Procedure: COLONOSCOPY WITH PROPOFOL;  Surgeon: Jaynie Collins, DO;  Location: [facility] ENDOSCOPY;  Service: Gastroenterology;  Laterality: N/A;  DM   ESOPHAGOGASTRODUODENOSCOPY N/A 11/11/2020   Procedure: ESOPHAGOGASTRODUODENOSCOPY (EGD);  Surgeon: Jaynie Collins, DO;  Location: [facility] ENDOSCOPY;  Service: Gastroenterology;  Laterality: N/A;   RIGHT/LEFT HEART CATH AND CORONARY ANGIOGRAPHY N/A 12/08/2020   Procedure: RIGHT/LEFT HEART CATH AND CORONARY ANGIOGRAPHY;  Surgeon: Lamar Blinks, MD;  Location: [facility] INVASIVE CV LAB;  Service: Cardiovascular;  Laterality: N/A;   TONSILLECTOMY      SOCIAL HISTORY: Social History   Socioeconomic History   Marital status: Married    Spouse name: Not on file   Number of children: Not on file   Years of education: Not on file   Highest education level: Not on file  Occupational History   Not on file  Tobacco Use   Smoking status: Never   Smokeless tobacco: Current    Types: Snuff   Tobacco comments:    Quit before surgery, surgery cancelled started back . Working on cessation   Vaping Use   Vaping status: Never Used  Substance and Sexual Activity   Alcohol use: Yes    Alcohol/week: 1.0 standard drink of alcohol    Types: 1 Cans of  beer per week    Comment: stopped 08/2020. 10/24/21 - occasional   Drug use: Never   Sexual activity: Not on file  Other Topics Concern   Not on file  Social History Narrative   Not on file   Social Determinants of Health   Financial Resource Strain: Low Risk  (05/02/2022)   Received from [facility], [facility]   Overall Financial Resource Strain (CARDIA)    Difficulty of Paying Living Expenses: Not hard at all  Food Insecurity: No Food Insecurity (05/02/2022)   Received from [facility], [facility]   Hunger Vital Sign    Worried About Running Out of Food in the Last Year: Never true    Ran Out of Food in the Last Year: Never true  Transportation Needs: No Transportation Needs (05/02/2022)   Received from [facility], [facility]   [facility] - Transportation    In the past 12 months, has lack of transportation kept you from medical appointments or from getting medications?: No    Lack of Transportation (Non-Medical):  No  Physical Activity: Not on file  Stress: Not on file  Social Connections: Not on file  Intimate Partner Violence: Not on file    FAMILY HISTORY: Family History  Problem Relation Age of Onset   Heart attack Father    Leukemia Paternal Uncle     ALLERGIES:  has No Known Allergies.  MEDICATIONS:  Current Outpatient Medications  Medication Sig Dispense Refill   Cholecalciferol (VITAMIN D-1000 MAX ST) 25 MCG (1000 UT) tablet Take by mouth.     glimepiride (AMARYL) 1 MG tablet Take 4 mg by mouth daily.     insulin degludec (TRESIBA FLEXTOUCH) 100 UNIT/ML FlexTouch Pen Inject 19 Units into the skin daily.     Iron-Vitamin C 65-125 MG TABS Take 1 tablet by mouth daily. 90 tablet 1   metFORMIN (GLUCOPHAGE) 500 MG tablet Take 1 tablet (500 mg total) by mouth 2 (two) times daily with a meal. 60 tablet 11   omeprazole (PRILOSEC) 20 MG capsule Take 20 mg by mouth  daily.     vitamin B-12 (CYANOCOBALAMIN) 1000 MCG tablet Take 1,000 mcg by mouth daily.     No current facility-administered medications for this visit.     PHYSICAL EXAMINATION: ECOG PERFORMANCE STATUS: 1 - Symptomatic but completely ambulatory Vitals:   09/12/22 1406  BP: 118/78  Pulse: 76  Resp: 18  Temp: (!) 97.1 F (36.2 C)  SpO2: 98%   Filed Weights   09/12/22 1406  Weight: 126 lb 9.6 oz (57.4 kg)    Physical Exam Constitutional:      General: He is not in acute distress. HENT:     Head: Normocephalic and atraumatic.  Eyes:     General: No scleral icterus. Cardiovascular:     Rate and Rhythm: Normal rate and regular rhythm.     Heart sounds: Normal heart sounds.  Pulmonary:     Effort: Pulmonary effort is normal. No respiratory distress.     Breath sounds: No wheezing.  Abdominal:     General: Bowel sounds are normal. There is no distension.     Palpations: Abdomen is soft.  Musculoskeletal:        General: No deformity. Normal range of motion.     Cervical back: Normal range of motion and neck supple.  Skin:    General: Skin is warm and dry.     Findings: No erythema or rash.  Neurological:     Mental Status: He is alert and oriented to person, place, and time. Mental status is at baseline.     Cranial Nerves: No cranial nerve deficit.     Coordination: Coordination normal.  Psychiatric:        Mood and Affect: Mood normal.     LABORATORY DATA:  I have reviewed the data as listed    Latest Ref Rng & Units 09/11/2022    8:00 AM 06/13/2022   10:35 AM 05/21/2022   11:04 AM  CBC  WBC 4.0 - 10.5 K/uL 4.0  7.0  6.1   Hemoglobin 13.0 - 17.0 g/dL 10.9  32.3  9.8   Hematocrit 39.0 - 52.0 % 36.0  32.9  32.5   Platelets 150 - 400 K/uL 161  219  266       Latest Ref Rng & Units 09/11/2022    8:00 AM 08/16/2020    3:59 PM  CMP  Glucose 70 - 99 mg/dL  557   BUN 8 - 23 mg/dL  10   Creatinine 3.22 - 1.24  mg/dL  8.29   Sodium 562 - 130 mmol/L  131    Potassium 3.5 - 5.1 mmol/L  4.0   Chloride 98 - 111 mmol/L  94   CO2 22 - 32 mmol/L  24   Calcium 8.9 - 10.3 mg/dL  9.1   Total Protein 6.5 - 8.1 g/dL 7.3  7.4   Total Bilirubin 0.3 - 1.2 mg/dL 0.7  1.5   Alkaline Phos 38 - 126 U/L 131  166   AST 15 - 41 U/L 29  33   ALT 0 - 44 U/L 21  21      Iron/TIBC/Ferritin/ %Sat    Component Value Date/Time   IRON 54 09/11/2022 0800   TIBC 389 09/11/2022 0800   FERRITIN 18 (L) 09/11/2022 0800   IRONPCTSAT 14 (L) 09/11/2022 0800      RADIOGRAPHIC STUDIES: I have personally reviewed the radiological images as listed and agreed with the findings in the report. No results found.

## 2022-09-13 LAB — COMP PANEL: LEUKEMIA/LYMPHOMA

## 2022-09-19 ENCOUNTER — Inpatient Hospital Stay: Payer: Medicare HMO

## 2022-09-19 VITALS — BP 121/72 | HR 72 | Temp 97.2°F

## 2022-09-19 DIAGNOSIS — D509 Iron deficiency anemia, unspecified: Secondary | ICD-10-CM | POA: Diagnosis not present

## 2022-09-19 MED ORDER — SODIUM CHLORIDE 0.9 % IV SOLN
Freq: Once | INTRAVENOUS | Status: AC
  Filled 2022-09-19: qty 250

## 2022-09-19 MED ORDER — SODIUM CHLORIDE 0.9 % IV SOLN
200.0000 mg | Freq: Once | INTRAVENOUS | Status: AC
  Administered 2022-09-19: 200 mg via INTRAVENOUS
  Filled 2022-09-19: qty 200

## 2022-09-19 NOTE — Progress Notes (Signed)
Patient declined to wait the 30 minutes for post iron infusion observation today. Tolerated infusion well. VSS. 

## 2022-09-26 ENCOUNTER — Inpatient Hospital Stay: Payer: Medicare HMO

## 2022-09-26 VITALS — BP 121/69 | HR 69 | Temp 98.1°F | Resp 16

## 2022-09-26 DIAGNOSIS — Z23 Encounter for immunization: Secondary | ICD-10-CM

## 2022-09-26 DIAGNOSIS — D509 Iron deficiency anemia, unspecified: Secondary | ICD-10-CM

## 2022-09-26 MED ORDER — SODIUM CHLORIDE 0.9 % IV SOLN
200.0000 mg | Freq: Once | INTRAVENOUS | Status: AC
  Administered 2022-09-26: 200 mg via INTRAVENOUS
  Filled 2022-09-26: qty 200

## 2022-09-26 MED ORDER — INFLUENZA VAC A&B SURF ANT ADJ 0.5 ML IM SUSY
0.5000 mL | PREFILLED_SYRINGE | Freq: Once | INTRAMUSCULAR | Status: AC
  Administered 2022-09-26: 0.5 mL via INTRAMUSCULAR
  Filled 2022-09-26: qty 0.5

## 2022-09-26 MED ORDER — SODIUM CHLORIDE 0.9 % IV SOLN
Freq: Once | INTRAVENOUS | Status: AC
  Filled 2022-09-26: qty 250

## 2022-09-26 NOTE — Patient Instructions (Signed)
Iron Sucrose Injection What is this medication? IRON SUCROSE (EYE ern SOO krose) treats low levels of iron (iron deficiency anemia) in people with kidney disease. Iron is a mineral that plays an important role in making red blood cells, which carry oxygen from your lungs to the rest of your body. This medicine may be used for other purposes; ask your health care provider or pharmacist if you have questions. COMMON BRAND NAME(S): Venofer What should I tell my care team before I take this medication? They need to know if you have any of these conditions: Anemia not caused by low iron levels Heart disease High levels of iron in the blood Kidney disease Liver disease An unusual or allergic reaction to iron, other medications, foods, dyes, or preservatives Pregnant or trying to get pregnant Breastfeeding How should I use this medication? This medication is for infusion into a vein. It is given in a hospital or clinic setting. Talk to your care team about the use of this medication in children. While this medication may be prescribed for children as young as 2 years for selected conditions, precautions do apply. Overdosage: If you think you have taken too much of this medicine contact a poison control center or emergency room at once. NOTE: This medicine is only for you. Do not share this medicine with others. What if I miss a dose? Keep appointments for follow-up doses. It is important not to miss your dose. Call your care team if you are unable to keep an appointment. What may interact with this medication? Do not take this medication with any of the following: Deferoxamine Dimercaprol Other iron products This medication may also interact with the following: Chloramphenicol Deferasirox This list may not describe all possible interactions. Give your health care provider a list of all the medicines, herbs, non-prescription drugs, or dietary supplements you use. Also tell them if you smoke,  drink alcohol, or use illegal drugs. Some items may interact with your medicine. What should I watch for while using this medication? Visit your care team regularly. Tell your care team if your symptoms do not start to get better or if they get worse. You may need blood work done while you are taking this medication. You may need to follow a special diet. Talk to your care team. Foods that contain iron include: whole grains/cereals, dried fruits, beans, or peas, leafy green vegetables, and organ meats (liver, kidney). What side effects may I notice from receiving this medication? Side effects that you should report to your care team as soon as possible: Allergic reactions--skin rash, itching, hives, swelling of the face, lips, tongue, or throat Low blood pressure--dizziness, feeling faint or lightheaded, blurry vision Shortness of breath Side effects that usually do not require medical attention (report to your care team if they continue or are bothersome): Flushing Headache Joint pain Muscle pain Nausea Pain, redness, or irritation at injection site This list may not describe all possible side effects. Call your doctor for medical advice about side effects. You may report side effects to FDA at 1-800-FDA-1088. Where should I keep my medication? This medication is given in a hospital or clinic. It will not be stored at home. NOTE: This sheet is a summary. It may not cover all possible information. If you have questions about this medicine, talk to your doctor, pharmacist, or health care provider.  2024 Elsevier/Gold Standard (2022-05-25 00:00:00)

## 2023-01-07 ENCOUNTER — Other Ambulatory Visit: Payer: Medicare HMO

## 2023-01-08 ENCOUNTER — Encounter: Payer: Self-pay | Admitting: Oncology

## 2023-01-08 ENCOUNTER — Inpatient Hospital Stay: Payer: Medicare Other | Attending: Oncology

## 2023-01-08 DIAGNOSIS — K7460 Unspecified cirrhosis of liver: Secondary | ICD-10-CM | POA: Insufficient documentation

## 2023-01-08 DIAGNOSIS — E538 Deficiency of other specified B group vitamins: Secondary | ICD-10-CM | POA: Insufficient documentation

## 2023-01-08 DIAGNOSIS — D509 Iron deficiency anemia, unspecified: Secondary | ICD-10-CM | POA: Diagnosis present

## 2023-01-08 DIAGNOSIS — Z79899 Other long term (current) drug therapy: Secondary | ICD-10-CM | POA: Diagnosis not present

## 2023-01-08 DIAGNOSIS — D500 Iron deficiency anemia secondary to blood loss (chronic): Secondary | ICD-10-CM

## 2023-01-08 LAB — CBC (CANCER CENTER ONLY)
HCT: 39.6 % (ref 39.0–52.0)
Hemoglobin: 13.7 g/dL (ref 13.0–17.0)
MCH: 32.9 pg (ref 26.0–34.0)
MCHC: 34.6 g/dL (ref 30.0–36.0)
MCV: 95.2 fL (ref 80.0–100.0)
Platelet Count: 216 10*3/uL (ref 150–400)
RBC: 4.16 MIL/uL — ABNORMAL LOW (ref 4.22–5.81)
RDW: 13.6 % (ref 11.5–15.5)
WBC Count: 4.3 10*3/uL (ref 4.0–10.5)
nRBC: 0 % (ref 0.0–0.2)

## 2023-01-08 LAB — FERRITIN: Ferritin: 49 ng/mL (ref 24–336)

## 2023-01-08 LAB — IRON AND TIBC
Iron: 93 ug/dL (ref 45–182)
Saturation Ratios: 22 % (ref 17.9–39.5)
TIBC: 426 ug/dL (ref 250–450)
UIBC: 333 ug/dL

## 2023-01-09 ENCOUNTER — Encounter: Payer: Self-pay | Admitting: Oncology

## 2023-01-09 ENCOUNTER — Inpatient Hospital Stay: Payer: Medicare Other

## 2023-01-09 ENCOUNTER — Inpatient Hospital Stay (HOSPITAL_BASED_OUTPATIENT_CLINIC_OR_DEPARTMENT_OTHER): Payer: Medicare Other | Admitting: Oncology

## 2023-01-09 VITALS — BP 120/71 | HR 75 | Temp 96.8°F | Resp 18 | Wt 126.2 lb

## 2023-01-09 DIAGNOSIS — D509 Iron deficiency anemia, unspecified: Secondary | ICD-10-CM | POA: Diagnosis not present

## 2023-01-09 DIAGNOSIS — K7030 Alcoholic cirrhosis of liver without ascites: Secondary | ICD-10-CM

## 2023-01-09 DIAGNOSIS — D500 Iron deficiency anemia secondary to blood loss (chronic): Secondary | ICD-10-CM | POA: Diagnosis not present

## 2023-01-09 MED ORDER — IRON-VITAMIN C 65-125 MG PO TABS: 1.0000 | ORAL_TABLET | ORAL | Status: AC

## 2023-01-09 NOTE — Assessment & Plan Note (Signed)
Follow up with GI for [facility] surveillance, Endoscopy surveillance.

## 2023-01-09 NOTE — Assessment & Plan Note (Addendum)
 Labs are reviewed and discussed with patient.  Lab Results  Component Value Date   HGB 13.7 01/08/2023   TIBC 426 01/08/2023   IRONPCTSAT 22 01/08/2023   FERRITIN 49 01/08/2023   No need for Venofer.  Recommend vitron c 1 tablet every other day

## 2023-01-09 NOTE — Progress Notes (Signed)
 Hematology/Oncology Progress note [phone] [phone]         Patient Care Team: Epifanio Alm SQUIBB, MD as PCP - General (Infectious Diseases) Babara Call, MD as Consulting Physician (Oncology)  CHIEF COMPLAINTS/REASON FOR VISIT:  anemia  ASSESSMENT & PLAN:   Iron  deficiency anemia Labs are reviewed and discussed with patient.  Lab Results  Component Value Date   HGB 13.7 01/08/2023   TIBC 426 01/08/2023   IRONPCTSAT 22 01/08/2023   FERRITIN 49 01/08/2023   No need for Venofer .  Recommend vitron c 1 tablet every other day   Liver cirrhosis (HCC) Follow up with GI for [facility] surveillance, Endoscopy surveillance.     Orders Placed This Encounter  Procedures   CBC with Differential (Cancer Center Only)    Standing Status:   Future    Expected Date:   07/09/2023    Expiration Date:   01/09/2024   Iron  and TIBC    Standing Status:   Future    Expected Date:   07/09/2023    Expiration Date:   01/09/2024   Ferritin    Standing Status:   Future    Expected Date:   07/09/2023    Expiration Date:   01/09/2024   AFP tumor marker    Standing Status:   Future    Expected Date:   07/09/2023    Expiration Date:   01/09/2024   Follow up 3 months lab MD Venofer .   All questions were answered. The patient knows to call the clinic with any problems, questions or concerns.  Call Babara, MD, PhD [facility] Hematology Oncology 01/09/2023   HISTORY OF PRESENTING ILLNESS:   Christopher Huff is a  68 y.o.  male with PMH listed below was seen in consultation at the request of  Epifanio Alm SQUIBB, MD  for evaluation of weight loss, anemia, thrombocytopenia  Patient has a history of alcohol dependence, status post cessation in August 2022. 08/16/2020 CT abdomen pelvis with contrast showed which showed cirrhosis with mild is ascites.  Prominent vascularity along the distal aspect of the esophagus suspicious for varices.  duodenitis.   11/06/2020, MRI abdomen with and without  contrast showed cirrhotic hepatic morphology.  Portal hypertension.  Splenomegaly.  No arterially enhancing hepatic lesion.  No pancreatic lesion  He has noted 50 pound weight loss over the past 1 year.  Appetite is fair.  Denies any nausea vomiting diarrhea, coffee-ground emesis, hematemesis abdominal pain, dysphagia. He has CT chest without contrast scheduled for work-up of unintentional weight loss. 11/29/2020, CBC showed mild anemia with a hemoglobin of 13.1, MCV 99.2, platelet count 146,000.  Slight leukopenia with total WBC 3.8, normal differential.  Previous work-up results include Acute hepatitis panel negative.  Normal ferritin level.  Normal thyroid function He has vitamin B12 deficiency, vitamin D level was decreased at 240.  Patient has been on vitamin B-12 injections as well as oral B12 supplements.   Denies night sweats, fever.    INTERVAL HISTORY Christopher Huff is a 68 y.o. male who has above history reviewed by me today presents for reestablish care for IDA.  He tolerated iron  deficiency anemia.  Appetite is good. Weight is stable. Fatigue has improved.    Review of Systems  Constitutional:  Negative for appetite change, chills, fatigue, fever and unexpected weight change.  HENT:   Negative for hearing loss and voice change.   Eyes:  Negative for eye problems and icterus.  Respiratory:  Negative for chest tightness,  cough and shortness of breath.   Cardiovascular:  Negative for chest pain and leg swelling.  Gastrointestinal:  Negative for abdominal distention, abdominal pain, constipation and diarrhea.  Endocrine: Negative for hot flashes.  Genitourinary:  Negative for difficulty urinating, dysuria and frequency.   Musculoskeletal:  Negative for arthralgias.  Skin:  Negative for itching and rash.  Neurological:  Negative for light-headedness and numbness.  Hematological:  Negative for adenopathy. Does not bruise/bleed easily.  Psychiatric/Behavioral:  Negative for  confusion.     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   [facility]   CLOSED MANIPULATION SHOULDER WITH STERIOD INJECTION Left 10/27/2021   Procedure: Left shoulder arthroscopic lysis of adhesions, capsular release, and manipulation under anesthesia with corticosteroid injection;  Surgeon: Tobie Priest, MD;  Location: [facility];  Service: Orthopedics;  Laterality: Left;  Diabetic   COLONOSCOPY     COLONOSCOPY WITH PROPOFOL  N/A 11/11/2020   Procedure: COLONOSCOPY WITH PROPOFOL ;  Surgeon: Onita Elspeth Sharper, DO;  Location: [facility] ENDOSCOPY;  Service: Gastroenterology;  Laterality: N/A;  DM   ESOPHAGOGASTRODUODENOSCOPY N/A 11/11/2020   Procedure: ESOPHAGOGASTRODUODENOSCOPY (EGD);  Surgeon: Onita Elspeth Sharper, DO;  Location: [facility] ENDOSCOPY;  Service: Gastroenterology;  Laterality: N/A;   RIGHT/LEFT HEART CATH AND CORONARY ANGIOGRAPHY N/A 12/08/2020   Procedure: RIGHT/LEFT HEART CATH AND CORONARY ANGIOGRAPHY;  Surgeon: Hester Wolm PARAS, MD;  Location: [facility] INVASIVE CV LAB;  Service: Cardiovascular;  Laterality: N/A;   TONSILLECTOMY      SOCIAL HISTORY: Social History   Socioeconomic History   Marital status: Married    Spouse name: Not on file   Number of children: Not on file   Years of education: Not on file   Highest education level: Not on file  Occupational History   Not on file  Tobacco Use   Smoking status: Never   Smokeless tobacco: Current    Types: Snuff   Tobacco comments:    Quit before surgery, surgery cancelled started back . Working on cessation   Vaping Use   Vaping status: Never Used  Substance and Sexual Activity   Alcohol use: Yes    Alcohol/week: 1.0  standard drink of alcohol    Types: 1 Cans of beer per week    Comment: stopped 08/2020. 10/24/21 - occasional   Drug use: Never   Sexual activity: Not on file  Other Topics Concern   Not on file  Social History Narrative   Not on file   Social Drivers of Health   Financial Resource Strain: Low Risk  (05/02/2022)   Received from [facility], [facility]   Overall Financial Resource Strain (CARDIA)    Difficulty of Paying Living Expenses: Not hard at all  Food Insecurity: No Food Insecurity (05/02/2022)   Received from [facility], [facility]   Hunger Vital Sign    Worried About Running Out of Food in the Last Year: Never true    Ran Out of Food in the Last Year: Never true  Transportation Needs: No Transportation Needs (05/02/2022)   Received from [facility], [facility]   [facility] - Transportation    In the  past 12 months, has lack of transportation kept you from medical appointments or from getting medications?: No    Lack of Transportation (Non-Medical): No  Physical Activity: Not on file  Stress: Not on file  Social Connections: Not on file  Intimate Partner Violence: Not on file    FAMILY HISTORY: Family History  Problem Relation Age of Onset   Heart attack Father    Leukemia Paternal Uncle     ALLERGIES:  has no known allergies.  MEDICATIONS:  Current Outpatient Medications  Medication Sig Dispense Refill   Cholecalciferol (VITAMIN D-1000 MAX ST) 25 MCG (1000 UT) tablet Take by mouth.     glimepiride (AMARYL) 1 MG tablet Take 4 mg by mouth daily.     insulin  degludec (TRESIBA FLEXTOUCH) 100 UNIT/ML FlexTouch Pen Inject 19 Units into the skin daily.     metFORMIN  (GLUCOPHAGE ) 500 MG tablet Take 1 tablet (500 mg total) by mouth 2 (two) times daily with a meal. 60 tablet 11   omeprazole (PRILOSEC) 20 MG capsule Take 20 mg by mouth daily.     rosuvastatin (CRESTOR)  5 MG tablet Take by mouth.     vitamin B-12 (CYANOCOBALAMIN) 1000 MCG tablet Take 1,000 mcg by mouth daily.     Iron -Vitamin C  65-125 MG TABS Take 1 tablet by mouth every other day.     No current facility-administered medications for this visit.     PHYSICAL EXAMINATION: ECOG PERFORMANCE STATUS: 1 - Symptomatic but completely ambulatory Vitals:   01/09/23 1403  BP: 120/71  Pulse: 75  Resp: 18  Temp: (!) 96.8 F (36 C)  SpO2: 100%   Filed Weights   01/09/23 1403  Weight: 126 lb 3.2 oz (57.2 kg)    Physical Exam Constitutional:      General: He is not in acute distress. HENT:     Head: Normocephalic and atraumatic.  Eyes:     General: No scleral icterus. Cardiovascular:     Rate and Rhythm: Normal rate and regular rhythm.     Heart sounds: Normal heart sounds.  Pulmonary:     Effort: Pulmonary effort is normal. No respiratory distress.     Breath sounds: No wheezing.  Abdominal:     General: Bowel sounds are normal. There is no distension.     Palpations: Abdomen is soft.  Musculoskeletal:        General: No deformity. Normal range of motion.     Cervical back: Normal range of motion and neck supple.  Skin:    General: Skin is warm and dry.     Findings: No erythema or rash.  Neurological:     Mental Status: He is alert and oriented to person, place, and time. Mental status is at baseline.     Cranial Nerves: No cranial nerve deficit.     Coordination: Coordination normal.  Psychiatric:        Mood and Affect: Mood normal.     LABORATORY DATA:  I have reviewed the data as listed    Latest Ref Rng & Units 01/08/2023    7:50 AM 09/11/2022    8:00 AM 06/13/2022   10:35 AM  CBC  WBC 4.0 - 10.5 K/uL 4.3  4.0  7.0   Hemoglobin 13.0 - 17.0 g/dL 86.2  88.2  89.7   Hematocrit 39.0 - 52.0 % 39.6  36.0  32.9   Platelets 150 - 400 K/uL 216  161  219       Latest Ref Rng &  Units 09/11/2022    8:00 AM 08/16/2020    3:59 PM  CMP  Glucose 70 - 99 mg/dL  504   BUN 8  - 23 mg/dL  10   Creatinine 9.38 - 1.24 mg/dL  9.32   Sodium 864 - 854 mmol/L  131   Potassium 3.5 - 5.1 mmol/L  4.0   Chloride 98 - 111 mmol/L  94   CO2 22 - 32 mmol/L  24   Calcium 8.9 - 10.3 mg/dL  9.1   Total Protein 6.5 - 8.1 g/dL 7.3  7.4   Total Bilirubin 0.3 - 1.2 mg/dL 0.7  1.5   Alkaline Phos 38 - 126 U/L 131  166   AST 15 - 41 U/L 29  33   ALT 0 - 44 U/L 21  21      Iron /TIBC/Ferritin/ %Sat    Component Value Date/Time   IRON  93 01/08/2023 0750   TIBC 426 01/08/2023 0750   FERRITIN 49 01/08/2023 0750   IRONPCTSAT 22 01/08/2023 0750      RADIOGRAPHIC STUDIES: I have personally reviewed the radiological images as listed and agreed with the findings in the report. No results found.

## 2023-01-22 ENCOUNTER — Ambulatory Visit
Admission: RE | Admit: 2023-01-22 | Discharge: 2023-01-22 | Disposition: A | Discharge status: Home / Self Care | Payer: Medicare Other | Source: Ambulatory Visit | Attending: Gastroenterology | Admitting: Gastroenterology

## 2023-01-22 ENCOUNTER — Encounter: Payer: Self-pay | Admitting: Oncology

## 2023-01-22 DIAGNOSIS — K7460 Unspecified cirrhosis of liver: Secondary | ICD-10-CM

## 2023-02-26 ENCOUNTER — Other Ambulatory Visit: Payer: Self-pay | Admitting: Gastroenterology

## 2023-02-26 DIAGNOSIS — K7460 Unspecified cirrhosis of liver: Secondary | ICD-10-CM

## 2023-07-03 ENCOUNTER — Encounter: Payer: Self-pay | Admitting: Gastroenterology

## 2023-07-09 ENCOUNTER — Inpatient Hospital Stay: Payer: Medicare Other | Attending: Oncology

## 2023-07-09 ENCOUNTER — Ambulatory Visit
Admission: RE | Admit: 2023-07-09 | Discharge: 2023-07-09 | Disposition: A | Discharge status: Home / Self Care | Payer: Medicare Other | Source: Ambulatory Visit | Attending: Gastroenterology

## 2023-07-09 DIAGNOSIS — K7460 Unspecified cirrhosis of liver: Secondary | ICD-10-CM | POA: Insufficient documentation

## 2023-07-09 DIAGNOSIS — D509 Iron deficiency anemia, unspecified: Secondary | ICD-10-CM | POA: Insufficient documentation

## 2023-07-09 DIAGNOSIS — Z79899 Other long term (current) drug therapy: Secondary | ICD-10-CM | POA: Diagnosis not present

## 2023-07-09 DIAGNOSIS — D500 Iron deficiency anemia secondary to blood loss (chronic): Secondary | ICD-10-CM

## 2023-07-09 LAB — CBC WITH DIFFERENTIAL (CANCER CENTER ONLY)
Abs Immature Granulocytes: 0.01 K/uL (ref 0.00–0.07)
Basophils Absolute: 0.1 K/uL (ref 0.0–0.1)
Basophils Relative: 2 %
Eosinophils Absolute: 0.2 K/uL (ref 0.0–0.5)
Eosinophils Relative: 6 %
HCT: 36.1 % — ABNORMAL LOW (ref 39.0–52.0)
Hemoglobin: 12.3 g/dL — ABNORMAL LOW (ref 13.0–17.0)
Immature Granulocytes: 0 %
Lymphocytes Relative: 22 %
Lymphs Abs: 0.8 K/uL (ref 0.7–4.0)
MCH: 32.7 pg (ref 26.0–34.0)
MCHC: 34.1 g/dL (ref 30.0–36.0)
MCV: 96 fL (ref 80.0–100.0)
Monocytes Absolute: 0.4 K/uL (ref 0.1–1.0)
Monocytes Relative: 11 %
Neutro Abs: 2 K/uL (ref 1.7–7.7)
Neutrophils Relative %: 59 %
Platelet Count: 146 K/uL — ABNORMAL LOW (ref 150–400)
RBC: 3.76 MIL/uL — ABNORMAL LOW (ref 4.22–5.81)
RDW: 13.2 % (ref 11.5–15.5)
WBC Count: 3.4 K/uL — ABNORMAL LOW (ref 4.0–10.5)
nRBC: 0 % (ref 0.0–0.2)

## 2023-07-09 LAB — IRON AND TIBC
Iron: 81 ug/dL (ref 45–182)
Saturation Ratios: 22 % (ref 17.9–39.5)
TIBC: 367 ug/dL (ref 250–450)
UIBC: 286 ug/dL

## 2023-07-09 LAB — FERRITIN: Ferritin: 41 ng/mL (ref 24–336)

## 2023-07-09 MED ORDER — IOPAMIDOL (ISOVUE-300) INJECTION 61%
100.0000 mL | Freq: Once | INTRAVENOUS | Status: AC | PRN
  Administered 2023-07-09: 100 mL via INTRAVENOUS

## 2023-07-10 ENCOUNTER — Inpatient Hospital Stay: Payer: Medicare Other | Admitting: Oncology

## 2023-07-10 ENCOUNTER — Inpatient Hospital Stay: Payer: Medicare Other

## 2023-07-10 ENCOUNTER — Encounter: Payer: Self-pay | Admitting: Oncology

## 2023-07-10 VITALS — BP 125/68 | HR 69 | Temp 98.3°F | Resp 16 | Wt 125.3 lb

## 2023-07-10 DIAGNOSIS — D500 Iron deficiency anemia secondary to blood loss (chronic): Secondary | ICD-10-CM | POA: Diagnosis not present

## 2023-07-10 DIAGNOSIS — D509 Iron deficiency anemia, unspecified: Secondary | ICD-10-CM | POA: Diagnosis not present

## 2023-07-10 DIAGNOSIS — K7030 Alcoholic cirrhosis of liver without ascites: Secondary | ICD-10-CM

## 2023-07-10 LAB — AFP TUMOR MARKER: AFP, Serum, Tumor Marker: 2.6 ng/mL (ref 0.0–8.4)

## 2023-07-10 NOTE — Progress Notes (Signed)
 Hematology/Oncology Progress note [phone] [phone]         Patient Care Team: Epifanio Alm SQUIBB, MD as PCP - General (Infectious Diseases) Babara Call, MD as Consulting Physician (Oncology)  CHIEF COMPLAINTS/REASON FOR VISIT:  anemia  ASSESSMENT & PLAN:   Iron  deficiency anemia Labs are reviewed and discussed with patient.  Lab Results  Component Value Date   HGB 12.3 (L) 07/09/2023   TIBC 367 07/09/2023   IRONPCTSAT 22 07/09/2023   FERRITIN 41 07/09/2023   No need for Venofer .  Continue vitron c 1 tablet every day   Liver cirrhosis (HCC) Follow up with GI for [facility] surveillance, Endoscopy surveillance.     Orders Placed This Encounter  Procedures   CBC with Differential (Cancer Center Only)    Standing Status:   Future    Expected Date:   11/10/2023    Expiration Date:   02/08/2024   Iron  and TIBC    Standing Status:   Future    Expected Date:   11/10/2023    Expiration Date:   02/08/2024   Ferritin    Standing Status:   Future    Expected Date:   11/10/2023    Expiration Date:   02/08/2024   AFP tumor marker    Standing Status:   Future    Expected Date:   11/10/2023    Expiration Date:   02/08/2024   Follow up  6 months lab MD Venofer .   All questions were answered. The patient knows to call the clinic with any problems, questions or concerns.  Call Babara, MD, PhD [facility] Hematology Oncology 07/10/2023   HISTORY OF PRESENTING ILLNESS:   Christopher Huff is a  68 y.o.  male with PMH listed below was seen in consultation at the request of  Epifanio Alm SQUIBB, MD  for evaluation of weight loss, anemia, thrombocytopenia  Patient has a history of alcohol dependence, status post cessation in August 2022. 08/16/2020 CT abdomen pelvis with contrast showed which showed cirrhosis with mild is ascites.  Prominent vascularity along the distal aspect of the esophagus suspicious for varices.  duodenitis.   11/06/2020, MRI abdomen with and without  contrast showed cirrhotic hepatic morphology.  Portal hypertension.  Splenomegaly.  No arterially enhancing hepatic lesion.  No pancreatic lesion  He has noted 50 pound weight loss over the past 1 year.  Appetite is fair.  Denies any nausea vomiting diarrhea, coffee-ground emesis, hematemesis abdominal pain, dysphagia. He has CT chest without contrast scheduled for work-up of unintentional weight loss. 11/29/2020, CBC showed mild anemia with a hemoglobin of 13.1, MCV 99.2, platelet count 146,000.  Slight leukopenia with total WBC 3.8, normal differential.  Previous work-up results include Acute hepatitis panel negative.  Normal ferritin level.  Normal thyroid function He has vitamin B12 deficiency, vitamin D level was decreased at 240.  Patient has been on vitamin B-12 injections as well as oral B12 supplements.   Denies night sweats, fever.    INTERVAL HISTORY Christopher Huff is a 68 y.o. male who has above history reviewed by me today presents for reestablish care for IDA.  He tolerated iron  deficiency anemia.  Appetite is good. Weight is stable. Fatigue has improved.    Review of Systems  Constitutional:  Negative for appetite change, chills, fatigue, fever and unexpected weight change.  HENT:   Negative for hearing loss and voice change.   Eyes:  Negative for eye problems and icterus.  Respiratory:  Negative for chest  tightness, cough and shortness of breath.   Cardiovascular:  Negative for chest pain and leg swelling.  Gastrointestinal:  Negative for abdominal distention, abdominal pain, constipation and diarrhea.  Endocrine: Negative for hot flashes.  Genitourinary:  Negative for difficulty urinating, dysuria and frequency.   Musculoskeletal:  Negative for arthralgias.  Skin:  Negative for itching and rash.  Neurological:  Negative for light-headedness and numbness.  Hematological:  Negative for adenopathy. Does not bruise/bleed easily.  Psychiatric/Behavioral:  Negative for  confusion.     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   [facility]   CLOSED MANIPULATION SHOULDER WITH STERIOD INJECTION Left 10/27/2021   Procedure: Left shoulder arthroscopic lysis of adhesions, capsular release, and manipulation under anesthesia with corticosteroid injection;  Surgeon: Tobie Priest, MD;  Location: [facility];  Service: Orthopedics;  Laterality: Left;  Diabetic   COLONOSCOPY     COLONOSCOPY WITH PROPOFOL  N/A 11/11/2020   Procedure: COLONOSCOPY WITH PROPOFOL ;  Surgeon: Onita Elspeth Sharper, DO;  Location: [facility] ENDOSCOPY;  Service: Gastroenterology;  Laterality: N/A;  DM   ESOPHAGOGASTRODUODENOSCOPY N/A 11/11/2020   Procedure: ESOPHAGOGASTRODUODENOSCOPY (EGD);  Surgeon: Onita Elspeth Sharper, DO;  Location: [facility] END[facility]COPY;  Service: Gastroenterology;  Laterality: N/A;   RIGHT/LEFT HEART CATH AND CORONARY ANGIOGRAPHY N/A 12/08/2020   Procedure: RIGHT/LEFT HEART CATH AND CORONARY ANGIOGRAPHY;  Surgeon: Hester Wolm PARAS, MD;  Location: [facility] INVASIVE CV LAB;  Service: Cardiovascular;  Laterality: N/A;   TONSILLECTOMY      SOCIAL HISTORY: Social History   Socioeconomic History   Marital status: Married    Spouse name: Not on file   Number of children: Not on file   Years of education: Not on file   Highest education level: Not on file  Occupational History   Not on file  Tobacco Use   Smoking status: Never   Smokeless tobacco: Current    Types: Snuff   Tobacco comments:    Quit before surgery, surgery cancelled started back . Working on cessation   Vaping Use   Vaping status: Never Used  Substance and Sexual Activity   Alcohol use: Yes    Alcohol/week: 1.0  standard drink of alcohol    Types: 1 Cans of beer per week    Comment: stopped 08/2020. 10/24/21 - occasional   Drug use: Never   Sexual activity: Not on file  Other Topics Concern   Not on file  Social History Narrative   Not on file   Social Drivers of Health   Financial Resource Strain: Low Risk  (05/02/2022)   Received from [facility]   Overall Financial Resource Strain (CARDIA)    Difficulty of Paying Living Expenses: Not hard at all  Food Insecurity: No Food Insecurity (05/02/2022)   Received from [facility]   Hunger Vital Sign    Within the past 12 months, you worried that your food would run out before you got the money to buy more.: Never true    Within the past 12 months, the food you bought just didn't last and you didn't have money to get more.: Never true  Transportation Needs: No Transportation Needs (05/02/2022)   Received from [facility]   PRAPARE - Transportation    In the past 12 months, has lack of transportation kept you from medical appointments or from getting medications?: No    Lack of Transportation (Non-Medical): No  Physical Activity: Not on file  Stress: Not on file  Social Connections: Not on file  Intimate Partner Violence: Not on file    FAMILY HISTORY: Family History  Problem Relation Age of Onset   Heart attack Father    Leukemia Paternal Uncle     ALLERGIES:  has no known allergies.  MEDICATIONS:  Current Outpatient Medications  Medication Sig Dispense Refill   Cholecalciferol (VITAMIN D-1000 MAX ST) 25 MCG (1000 UT) tablet Take by mouth.     glimepiride (AMARYL) 1 MG tablet Take 4 mg by mouth daily.     insulin  degludec (TRESIBA FLEXTOUCH) 100 UNIT/ML FlexTouch Pen Inject 19 Units into the skin daily.     Iron -Vitamin C  65-125 MG TABS Take 1 tablet by mouth every other day.     metFORMIN  (GLUCOPHAGE ) 500 MG tablet Take 1 tablet (500 mg total) by mouth 2 (two) times daily with a meal.  60 tablet 11   omeprazole (PRILOSEC) 20 MG capsule Take 20 mg by mouth daily.     rosuvastatin (CRESTOR) 5 MG tablet Take by mouth.     vitamin B-12 (CYANOCOBALAMIN) 1000 MCG tablet Take 1,000 mcg by mouth daily.     aspirin  EC 81 MG tablet Take 81 mg by mouth.     No current facility-administered medications for this visit.     PHYSICAL EXAMINATION: ECOG PERFORMANCE STATUS: 1 - Symptomatic but completely ambulatory Vitals:   07/10/23 1255  BP: 125/68  Pulse: 69  Resp: 16  Temp: 98.3 F (36.8 C)  SpO2: 100%   Filed Weights   07/10/23 1255  Weight: 125 lb 4.8 oz (56.8 kg)    Physical Exam Constitutional:      General: He is not in acute distress. HENT:     Head: Normocephalic and atraumatic.  Eyes:     General: No scleral icterus. Cardiovascular:     Rate and Rhythm: Normal rate and regular rhythm.     Heart sounds: Normal heart sounds.  Pulmonary:     Effort: Pulmonary effort is normal. No respiratory distress.     Breath sounds: No wheezing.  Abdominal:     General: Bowel sounds are normal. There is no distension.     Palpations: Abdomen is soft.  Musculoskeletal:        General: No deformity. Normal range of motion.     Cervical back: Normal range of motion and neck supple.  Skin:    General: Skin is warm and dry.     Findings: No erythema or rash.  Neurological:     Mental Status: He is alert and oriented to person, place, and time. Mental status is at baseline.     Cranial Nerves: No cranial nerve deficit.     Coordination: Coordination normal.  Psychiatric:        Mood and Affect: Mood normal.     LABORATORY DATA:  I have reviewed the data as listed    Latest Ref Rng & Units 07/09/2023    8:04 AM 01/08/2023    7:50 AM 09/11/2022    8:00 AM  CBC  WBC 4.0 - 10.5 K/uL 3.4  4.3  4.0   Hemoglobin 13.0 - 17.0 g/dL 87.6  86.2  88.2   Hematocrit 39.0 - 52.0 % 36.1  39.6  36.0   Platelets 150 - 400 K/uL 146  216  161       Latest Ref Rng & Units 09/11/2022     8:00 AM 08/16/2020    3:59 PM  CMP  Glucose 70 - 99 mg/dL  504   BUN 8 - 23 mg/dL  10   Creatinine 9.38 - 1.24 mg/dL  9.32   Sodium 864 - 854 mmol/L  131   Potassium 3.5 - 5.1 mmol/L  4.0   Chloride 98 - 111 mmol/L  94   CO2 22 - 32 mmol/L  24   Calcium 8.9 - 10.3 mg/dL  9.1   Total Protein 6.5 - 8.1 g/dL 7.3  7.4   Total Bilirubin 0.3 - 1.2 mg/dL 0.7  1.5   Alkaline Phos 38 - 126 U/L 131  166   AST 15 - 41 U/L 29  33   ALT 0 - 44 U/L 21  21      Iron /TIBC/Ferritin/ %Sat    Component Value Date/Time   IRON  81 07/09/2023 0804   TIBC 367 07/09/2023 0804   FERRITIN 41 07/09/2023 0804   IRONPCTSAT 22 07/09/2023 0804      RADIOGRAPHIC STUDIES: I have personally reviewed the radiological images as listed and agreed with the findings in the report. CT ABDOMEN PELVIS W CONTRAST Result Date: 07/09/2023 CLINICAL DATA:  Alcoholic cirrhosis. EXAM: CT ABDOMEN AND PELVIS WITH CONTRAST TECHNIQUE: Multidetector CT imaging of the abdomen and pelvis was performed using the standard protocol following bolus administration of intravenous contrast. RADIATION DOSE REDUCTION: This exam was performed according to the departmental dose-optimization program which includes automated exposure control, adjustment of the mA and/or kV according to patient size and/or use of iterative reconstruction technique. CONTRAST:  100mL ISOVUE -300 IOPAMIDOL  (ISOVUE -300) INJECTION 61% COMPARISON:  08/16/2020 FINDINGS: Lower Chest: No acute findings. Hepatobiliary: Cirrhosis again demonstrated. No hepatic masses identified. No ascites. Gallbladder is unremarkable. No evidence of biliary ductal dilatation. Pancreas:  No mass or inflammatory changes. Spleen: Within normal limits in size and appearance. Adrenals/Urinary Tract: No suspicious masses identified. No evidence of ureteral calculi or hydronephrosis. Stomach/Bowel: No evidence of obstruction, inflammatory process or abnormal fluid collections. Diverticulosis is seen  mainly involving the descending and sigmoid colon, however there is no evidence of diverticulitis. Vascular/Lymphatic: No pathologically enlarged lymph nodes. No acute vascular findings. Portosystemic venous collaterals seen in the gastrohepatic and gastrosplenic ligaments with mild distal esophageal varices, consistent with portal venous hypertension. Reproductive:  Stable mildly enlarged prostate. Other:  None. Musculoskeletal:  No suspicious bone lesions identified. IMPRESSION: Hepatic cirrhosis. No evidence of hepatic neoplasm or other acute findings. Upper abdominal portosystemic venous collaterals and mild distal esophageal varices, consistent with portal venous hypertension. Colonic diverticulosis, without radiographic evidence of diverticulitis. Stable mildly enlarged prostate. Electronically Signed   By: Norleen DELENA Kil M.D.   On: 07/09/2023 12:18

## 2023-07-10 NOTE — Assessment & Plan Note (Signed)
Follow up with GI for [facility] surveillance, Endoscopy surveillance.

## 2023-07-10 NOTE — Assessment & Plan Note (Addendum)
 Labs are reviewed and discussed with patient.  Lab Results  Component Value Date   HGB 12.3 (L) 07/09/2023   TIBC 367 07/09/2023   IRONPCTSAT 22 07/09/2023   FERRITIN 41 07/09/2023   No need for Venofer .  Continue vitron c 1 tablet every day

## 2023-08-20 ENCOUNTER — Other Ambulatory Visit: Payer: Self-pay | Admitting: Gastroenterology

## 2023-08-20 DIAGNOSIS — K7460 Unspecified cirrhosis of liver: Secondary | ICD-10-CM

## 2023-09-13 ENCOUNTER — Encounter: Payer: Self-pay | Admitting: Gastroenterology

## 2023-11-08 ENCOUNTER — Ambulatory Visit

## 2023-11-08 ENCOUNTER — Encounter: Payer: Self-pay | Admitting: Gastroenterology

## 2023-11-08 ENCOUNTER — Ambulatory Visit
Admission: RE | Admit: 2023-11-08 | Discharge: 2023-11-08 | Disposition: A | Discharge status: Home / Self Care | Payer: Medicare Other | Attending: Gastroenterology | Admitting: Gastroenterology

## 2023-11-08 ENCOUNTER — Encounter
Admission: RE | Disposition: A | Discharge status: Home / Self Care | Payer: Self-pay | Source: Home / Self Care | Attending: Gastroenterology

## 2023-11-08 DIAGNOSIS — K317 Polyp of stomach and duodenum: Secondary | ICD-10-CM | POA: Insufficient documentation

## 2023-11-08 DIAGNOSIS — Z794 Long term (current) use of insulin: Secondary | ICD-10-CM | POA: Insufficient documentation

## 2023-11-08 DIAGNOSIS — D125 Benign neoplasm of sigmoid colon: Secondary | ICD-10-CM | POA: Insufficient documentation

## 2023-11-08 DIAGNOSIS — K7460 Unspecified cirrhosis of liver: Secondary | ICD-10-CM | POA: Diagnosis not present

## 2023-11-08 DIAGNOSIS — D124 Benign neoplasm of descending colon: Secondary | ICD-10-CM | POA: Insufficient documentation

## 2023-11-08 DIAGNOSIS — Z7984 Long term (current) use of oral hypoglycemic drugs: Secondary | ICD-10-CM | POA: Diagnosis not present

## 2023-11-08 DIAGNOSIS — K640 First degree hemorrhoids: Secondary | ICD-10-CM | POA: Diagnosis not present

## 2023-11-08 DIAGNOSIS — K5730 Diverticulosis of large intestine without perforation or abscess without bleeding: Secondary | ICD-10-CM | POA: Insufficient documentation

## 2023-11-08 DIAGNOSIS — I10 Essential (primary) hypertension: Secondary | ICD-10-CM | POA: Insufficient documentation

## 2023-11-08 DIAGNOSIS — Z952 Presence of prosthetic heart valve: Secondary | ICD-10-CM | POA: Insufficient documentation

## 2023-11-08 DIAGNOSIS — K3189 Other diseases of stomach and duodenum: Secondary | ICD-10-CM | POA: Diagnosis not present

## 2023-11-08 DIAGNOSIS — K2270 Barrett's esophagus without dysplasia: Secondary | ICD-10-CM | POA: Insufficient documentation

## 2023-11-08 DIAGNOSIS — E119 Type 2 diabetes mellitus without complications: Secondary | ICD-10-CM | POA: Diagnosis not present

## 2023-11-08 DIAGNOSIS — I8510 Secondary esophageal varices without bleeding: Secondary | ICD-10-CM | POA: Diagnosis not present

## 2023-11-08 DIAGNOSIS — Z1211 Encounter for screening for malignant neoplasm of colon: Secondary | ICD-10-CM | POA: Insufficient documentation

## 2023-11-08 DIAGNOSIS — D123 Benign neoplasm of transverse colon: Secondary | ICD-10-CM | POA: Diagnosis not present

## 2023-11-08 DIAGNOSIS — K766 Portal hypertension: Secondary | ICD-10-CM | POA: Insufficient documentation

## 2023-11-08 DIAGNOSIS — K219 Gastro-esophageal reflux disease without esophagitis: Secondary | ICD-10-CM | POA: Insufficient documentation

## 2023-11-08 HISTORY — PX: POLYPECTOMY: SHX149

## 2023-11-08 HISTORY — PX: ESOPHAGOGASTRODUODENOSCOPY (EGD) WITH PROPOFOL: SHX5813

## 2023-11-08 HISTORY — DX: Nonrheumatic aortic (valve) stenosis: I35.0

## 2023-11-08 HISTORY — DX: Mixed hyperlipidemia: E78.2

## 2023-11-08 HISTORY — DX: Unspecified cirrhosis of liver: K74.60

## 2023-11-08 HISTORY — DX: Atherosclerosis of aorta: I70.0

## 2023-11-08 HISTORY — DX: Barrett's esophagus without dysplasia: K22.70

## 2023-11-08 HISTORY — PX: COLONOSCOPY WITH PROPOFOL: SHX5780

## 2023-11-08 LAB — GLUCOSE, CAPILLARY: Glucose-Capillary: 205 mg/dL — ABNORMAL HIGH (ref 70–99)

## 2023-11-08 SURGERY — COLONOSCOPY WITH PROPOFOL
Anesthesia: General

## 2023-11-08 MED ORDER — PROPOFOL 500 MG/50ML IV EMUL
INTRAVENOUS | Status: DC | PRN
  Administered 2023-11-08: 150 ug/kg/min via INTRAVENOUS
  Administered 2023-11-08: 120 mg via INTRAVENOUS

## 2023-11-08 MED ORDER — LIDOCAINE HCL (PF) 2 % IJ SOLN
INTRAMUSCULAR | Status: DC | PRN
Start: 2023-11-08 — End: 2023-11-08
  Administered 2023-11-08: 100 mg via INTRADERMAL

## 2023-11-08 MED ORDER — LIDOCAINE HCL (PF) 2 % IJ SOLN
INTRAMUSCULAR | Status: AC
Start: 2023-11-08 — End: 2023-11-08
  Filled 2023-11-08: qty 5

## 2023-11-08 MED ORDER — SODIUM CHLORIDE 0.9 % IV SOLN
INTRAVENOUS | Status: DC
  Administered 2023-11-08: 20 mL/h via INTRAVENOUS

## 2023-11-08 MED ORDER — PROPOFOL 1000 MG/100ML IV EMUL
INTRAVENOUS | Status: AC
  Filled 2023-11-08: qty 100

## 2023-11-08 NOTE — H&P (Signed)
 Pre-Procedure H&P   Patient ID: Christopher Huff is a 68 y.o. male.  Gastroenterology Provider: Elspeth Ozell Jungling, DO  PCP: Epifanio Alm SQUIBB, MD  Date: 11/08/2023  HPI [name] is a 68 y.o. male who presents today for Esophagogastroduodenoscopy and Colonoscopy for Cirrhosis, Barretts Esophagus, phx colon polyps .  No current GI Sx  11/2020- Barretts Esophagus (C0M4) without dysplasia.  Duodenal polyp, biopsies negative for celiac disease and H. Pylori 5 tubular adenomas removed from the colon, normal TI, internal hemorrhoids present.  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   [facility]   CLOSED MANIPULATION SHOULDER WITH STERIOD INJECTION Left 10/27/2021   Procedure: Left shoulder arthroscopic lysis of adhesions, capsular release, and manipulation under anesthesia with corticosteroid injection;  Surgeon: Tobie Priest, MD;  Location: [facility];  Service: Orthopedics;  Laterality: Left;  Diabetic   COLONOSCOPY     COLONOSCOPY WITH PROPOFOL  N/A 11/11/2020   Procedure: COLONOSCOPY WITH PROPOFOL ;  Surgeon: Jungling Elspeth Ozell, DO;  Location: [facility] ENDOSCOPY;  Service: Gastroenterology;  Laterality: N/A;  DM   ESOPHAGOGASTRODUODENOSCOPY N/A 11/11/2020   Procedure: ESOPHAGOGASTRODUODENOSCOPY (EGD);  Surgeon: Jungling Elspeth Ozell, DO;  Location: [facility] ENDOSCOPY;  Service: Gastroenterology;  Laterality: N/A;   IMPLANTATION, AORTIC VALVE, TRANSCATHETER, SUBCLAVIAN ARTERY APPROACH     GROIN   RIGHT/LEFT HEART CATH AND CORONARY ANGIOGRAPHY N/A  12/08/2020   Procedure: RIGHT/LEFT HEART CATH AND CORONARY ANGIOGRAPHY;  Surgeon: Hester Wolm PARAS, MD;  Location: [facility] INVASIVE CV LAB;  Service: Cardiovascular;  Laterality: N/A;   TONSILLECTOMY      Family History No h/o GI disease or malignancy  Review of Systems  Constitutional:  Negative for activity change, appetite change, chills, diaphoresis, fatigue, fever and unexpected weight change.  HENT:  Negative for trouble swallowing and voice change.   Respiratory:  Negative for shortness of breath and wheezing.   Cardiovascular:  Negative for chest pain, palpitations and leg swelling.  Gastrointestinal:  Negative for abdominal distention, abdominal pain, anal bleeding, blood in stool, constipation, diarrhea, nausea and vomiting.  Musculoskeletal:  Negative for arthralgias and myalgias.  Skin:  Negative for color change and pallor.  Neurological:  Negative for dizziness, syncope and weakness.  Psychiatric/Behavioral:  Negative for confusion. The patient is not nervous/anxious.   All other systems reviewed and are negative.    Medications No current facility-administered medications on file prior to encounter.   Current Outpatient Medications on File Prior to Encounter  Medication Sig Dispense Refill   Cholecalciferol (VITAMIN D-1000 MAX ST) 25 MCG (1000 UT) tablet Take by mouth.     insulin  degludec (TRESIBA FLEXTOUCH) 100 UNIT/ML FlexTouch Pen Inject 19 Units into the skin daily.     Iron -Vitamin C  65-125 MG TABS Take 1 tablet by mouth every other day.     omeprazole (PRILOSEC) 20 MG capsule Take 20 mg by mouth daily.     vitamin B-12 (CYANOCOBALAMIN) 1000 MCG tablet Take 1,000 mcg by mouth daily.     glimepiride (AMARYL) 1 MG tablet Take  4 mg by mouth daily.     metFORMIN  (GLUCOPHAGE ) 500 MG tablet Take 1 tablet (500 mg total) by mouth 2 (two) times daily with a meal. 60 tablet 11   rosuvastatin (CRESTOR) 5 MG tablet Take by mouth.      Pertinent medications related to GI and  procedure were reviewed by me with the patient prior to the procedure   Current Facility-Administered Medications:    0.9 %  sodium chloride  infusion, , Intravenous, Continuous, Onita Elspeth Sharper, DO, Last Rate: 20 mL/hr at 11/08/23 0659, 20 mL/hr at 11/08/23 0659  sodium chloride  20 mL/hr (11/08/23 0659)       No Known Allergies Allergies were reviewed by me prior to the procedure  Objective   Body mass index is 19.2 kg/m. Vitals:   11/08/23 0646  BP: 118/67  Pulse: 76  Resp: 20  Temp: (!) 96.7 F (35.9 C)  TempSrc: Temporal  SpO2: 100%  Weight: 55.6 kg  Height: 5' 7 (1.702 m)     Physical Exam Vitals and nursing note reviewed.  Constitutional:      General: He is not in acute distress.    Appearance: Normal appearance. He is not ill-appearing, toxic-appearing or diaphoretic.  HENT:     Head: Normocephalic and atraumatic.     Nose: Nose normal.     Mouth/Throat:     Mouth: Mucous membranes are moist.     Pharynx: Oropharynx is clear.  Eyes:     General: No scleral icterus.    Extraocular Movements: Extraocular movements intact.  Cardiovascular:     Rate and Rhythm: Normal rate and regular rhythm.     Heart sounds: Murmur heard.     No friction rub. No gallop.  Pulmonary:     Effort: Pulmonary effort is normal. No respiratory distress.     Breath sounds: Normal breath sounds. No wheezing, rhonchi or rales.  Abdominal:     General: Bowel sounds are normal. There is no distension.     Palpations: Abdomen is soft.     Tenderness: There is no abdominal tenderness. There is no guarding or rebound.  Musculoskeletal:     Cervical back: Neck supple.     Right lower leg: No edema.     Left lower leg: No edema.  Skin:    General: Skin is warm and dry.     Coloration: Skin is not jaundiced or pale.  Neurological:     General: No focal deficit present.     Mental Status: He is alert and oriented to person, place, and time. Mental status is at baseline.   Psychiatric:        Mood and Affect: Mood normal.        Behavior: Behavior normal.        Thought Content: Thought content normal.        Judgment: Judgment normal.      Assessment:  Mr. Christopher ZEIMET is a 68 y.o. male  who presents today for Esophagogastroduodenoscopy and Colonoscopy for Cirrhosis, Barretts Esophagus, phx colon polyps .  Plan:  Esophagogastroduodenoscopy and Colonoscopy with possible intervention today  Esophagogastroduodenoscopy and Colonoscopy with possible biopsy, control of bleeding, polypectomy, and interventions as necessary has been discussed with the patient/patient representative. Informed consent was obtained from the patient/patient representative after explaining the indication, nature, and risks of the procedure including but not limited to death, bleeding, perforation, missed neoplasm/lesions, cardiorespiratory compromise, and reaction to medications. Opportunity for questions was given and appropriate answers were  provided. Patient/patient representative has verbalized understanding is amenable to undergoing the procedure.   Elspeth Ozell Jungling, DO  [facility]  Portions of the record may have been created with voice recognition software. Occasional wrong-word or 'sound-a-like' substitutions may have occurred due to the inherent limitations of voice recognition software.  Read the chart carefully and recognize, using context, where substitutions may have occurred.

## 2023-11-08 NOTE — Anesthesia Preprocedure Evaluation (Signed)
 Anesthesia Evaluation  Patient identified by MRN, date of birth, ID band Patient awake    Reviewed: Allergy & Precautions, NPO status , Patient's Chart, lab work & pertinent test results  History of Anesthesia Complications Negative for: history of anesthetic complications  Airway Mallampati: III  TM Distance: <3 FB Neck ROM: full    Dental  (+) Chipped   Pulmonary neg pulmonary ROS, neg shortness of breath   Pulmonary exam normal        Cardiovascular Exercise Tolerance: Good hypertension, (-) angina Normal cardiovascular exam     Neuro/Psych  PSYCHIATRIC DISORDERS      negative neurological ROS     GI/Hepatic Neg liver ROS,GERD  Controlled,,  Endo/Other  diabetes, Type 2    Renal/GU negative Renal ROS  negative genitourinary   Musculoskeletal   Abdominal   Peds  Hematology negative hematology ROS (+)   Anesthesia Other Findings Past Medical History: No date: Alcohol dependence (HCC) No date: Atherosclerosis of abdominal aorta No date: Barrett's esophagus No date: Cirrhosis (HCC) No date: Diabetes mellitus without complication (HCC) No date: GERD (gastroesophageal reflux disease) No date: Hyperlipidemia, mixed No date: Hypertension No date: Nonrheumatic aortic (valve) stenosis No date: Weight loss  Past Surgical History: 04/10/2021: ANOMALOUS PULMONARY VENOUS RETURN REPAIR, TOTAL     Comment:  [facility] 10/27/2021: CLOSED MANIPULATION SHOULDER WITH STERIOD INJECTION; Left     Comment:  Procedure: Left shoulder arthroscopic lysis of               adhesions, capsular release, and manipulation under               anesthesia with corticosteroid injection;  Surgeon:               Tobie Priest, MD;  Location: [facility];                Service: Orthopedics;  Laterality: Left;  Diabetic No date: COLONOSCOPY 11/11/2020: COLONOSCOPY WITH PROPOFOL ; N/A     Comment:  Procedure: COLONOSCOPY WITH PROPOFOL ;   Surgeon: Onita Elspeth Sharper, DO;  Location: [facility] ENDOSCOPY;  Service:               Gastroenterology;  Laterality: N/A;  DM 11/11/2020: ESOPHAGOGASTRODUODENOSCOPY; N/A     Comment:  Procedure: ESOPHAGOGASTRODUODENOSCOPY (EGD);  Surgeon:               Onita Elspeth Sharper, DO;  Location: [facility] ENDOSCOPY;                Service: Gastroenterology;  Laterality: N/A; No date: IMPLANTATION, AORTIC VALVE, TRANSCATHETER, SUBCLAVIAN ARTERY  APPROACH     Comment:  [facility] 12/08/2020: RIGHT/LEFT HEART CATH AND CORONARY ANGIOGRAPHY; N/A     Comment:  Procedure: RIGHT/LEFT HEART CATH AND CORONARY               ANGIOGRAPHY;  Surgeon: Hester Wolm PARAS, MD;  Location:               [facility] INVASIVE CV LAB;  Service: Cardiovascular;                Laterality: N/A; No date: TONSILLECTOMY  BMI    Body Mass Index: 19.20 kg/m      Reproductive/Obstetrics negative OB ROS  Anesthesia Physical Anesthesia Plan  ASA: 3  Anesthesia Plan: General   Post-op Pain Management:    Induction: Intravenous  PONV Risk Score and Plan: Propofol  infusion and TIVA  Airway Management Planned: Natural Airway and Nasal Cannula  Additional Equipment:   Intra-op Plan:   Post-operative Plan:   Informed Consent: I have reviewed the patients History and Physical, chart, labs and discussed the procedure including the risks, benefits and alternatives for the proposed anesthesia with the patient or authorized representative who has indicated his/her understanding and acceptance.     Dental Advisory Given  Plan Discussed with: Anesthesiologist, CRNA and Surgeon  Anesthesia Plan Comments: (Patient consented for risks of anesthesia including but not limited to:  - adverse reactions to medications - risk of airway placement if required - damage to eyes, teeth, lips or other oral mucosa - nerve damage due to positioning  - sore throat or  hoarseness - Damage to heart, brain, nerves, lungs, other parts of body or loss of life  Patient voiced understanding and assent.)        Anesthesia Quick Evaluation

## 2023-11-08 NOTE — Addendum Note (Signed)
 Addendum  created 11/08/23 0952 by Tamrah Victorino R, CRNA   Flowsheet accepted

## 2023-11-08 NOTE — Op Note (Signed)
 [facility] Gastroenterology Patient Name: Christopher Huff Procedure Date: 11/08/2023 6:54 AM MRN: [id number] Account #: [id number] Date of Birth: [birth year] Admit Type: Outpatient Age: 68 Room: [facility] ENDO ROOM 1 Gender: Male Note Status: Finalized Instrument Name: Colon Scope [phone] [id number] Procedure:             Colonoscopy Indications:           High risk colon cancer surveillance: Personal history                         of colonic polyps Providers:             Elspeth Ozell Onita ROSALEA, DO Referring MD:          Alm Needle (Referring MD) Medicines:             Monitored Anesthesia Care Complications:         No immediate complications. Estimated blood loss:                         Minimal. Procedure:             Pre-Anesthesia Assessment:                        - Prior to the procedure, a History and Physical was                         performed, and patient medications and allergies were                         reviewed. The patient is competent. The risks and                         benefits of the procedure and the sedation options and                         risks were discussed with the patient. All questions                         were answered and informed consent was obtained.                         Patient identification and proposed procedure were                         verified by the physician, the nurse, the anesthetist                         and the technician in the endoscopy suite. Mental                         Status Examination: alert and oriented. Airway                         Examination: normal oropharyngeal airway and neck                         mobility. Respiratory Examination: clear to  auscultation. CV Examination: RRR, no murmurs, no S3                         or S4. Prophylactic Antibiotics: The patient does not                         require prophylactic antibiotics. Prior                          Anticoagulants: The patient has taken no anticoagulant                         or antiplatelet agents. ASA Grade Assessment: III - A                         patient with severe systemic disease. After reviewing                         the risks and benefits, the patient was deemed in                         satisfactory condition to undergo the procedure. The                         anesthesia plan was to use monitored anesthesia care                         (MAC). Immediately prior to administration of                         medications, the patient was re-assessed for adequacy                         to receive sedatives. The heart rate, respiratory                         rate, oxygen saturations, blood pressure, adequacy of                         pulmonary ventilation, and response to care were                         monitored throughout the procedure. The physical                         status of the patient was re-assessed after the                         procedure.                        After obtaining informed consent, the colonoscope was                         passed under direct vision. Throughout the procedure,                         the patient's blood pressure, pulse, and oxygen  saturations were monitored continuously. The                         Colonoscope was introduced through the anus and                         advanced to the the cecum, identified by appendiceal                         orifice and ileocecal valve. The colonoscopy was                         performed without difficulty. The patient tolerated                         the procedure well. The quality of the bowel                         preparation was poor. The ileocecal valve, appendiceal                         orifice, and rectum were photographed. Findings:      The perianal and digital rectal examinations were normal. Pertinent       negatives include normal sphincter  tone.      A moderate amount of semi-solid stool was found in the entire colon,       interfering with visualization. Estimated blood loss: none.      Multiple small-mouthed diverticula were found in the left colon.       Estimated blood loss: none.      Non-bleeding internal hemorrhoids were found during retroflexion. The       hemorrhoids were Grade I (internal hemorrhoids that do not prolapse).       Estimated blood loss: none.      Seven sessile polyps were found in the sigmoid colon (2), descending       colon (2), transverse colon (2) and cecum (1). The polyps were 1 to 2 mm       in size. These polyps were removed with a jumbo cold forceps. Resection       and retrieval were complete. Estimated blood loss was minimal.      The exam was otherwise without abnormality on direct and retroflexion       views.      Food/stool covering areas of the colonic mucosa limiting visualization       at times. Impression:            - Preparation of the colon was poor.                        - Stool in the entire examined colon.                        - Diverticulosis in the left colon.                        - Non-bleeding internal hemorrhoids.                        - Seven 1 to 2 mm polyps in the sigmoid colon, in the  descending colon, in the transverse colon and in the                         cecum, removed with a jumbo cold forceps. Resected and                         retrieved.                        - The examination was otherwise normal on direct and                         retroflexion views. Recommendation:        - Patient has a contact number available for                         emergencies. The signs and symptoms of potential                         delayed complications were discussed with the patient.                         Return to normal activities tomorrow. Written                         discharge instructions were provided to the patient.                         - Discharge patient to home.                        - Resume previous diet.                        - Continue present medications.                        - No ibuprofen , naproxen, or other non-steroidal                         anti-inflammatory drugs for 5 days after polyp removal.                        - Await pathology results.                        - Repeat colonoscopy in 1 year for surveillance based                         on pathology results.                        - Return to GI office as previously scheduled.                        - The findings and recommendations were discussed with                         the patient. Procedure Code(s):     --- Professional ---  54619, Colonoscopy, flexible; with biopsy, single or                         multiple Diagnosis Code(s):     --- Professional ---                        Z86.010, Personal history of colonic polyps                        K64.0, First degree hemorrhoids                        D12.5, Benign neoplasm of sigmoid colon                        D12.4, Benign neoplasm of descending colon                        D12.3, Benign neoplasm of transverse colon (hepatic                         flexure or splenic flexure)                        D12.0, Benign neoplasm of cecum                        K57.30, Diverticulosis of large intestine without                         perforation or abscess without bleeding CPT copyright 2022 American Medical Association. All rights reserved. The codes documented in this report are preliminary and upon coder review may  be revised to meet current compliance requirements. Attending Participation:      I personally performed the entire procedure. Elspeth Jungling, DO Elspeth Ozell Jungling DO, DO 11/08/2023 8:18:50 AM This report has been signed electronically. Number of Addenda: 0 Note Initiated On: 11/08/2023 6:54 AM Scope Withdrawal Time: 0 hours 10 minutes 39  seconds  Total Procedure Duration: 0 hours 15 minutes 2 seconds  Estimated Blood Loss:  Estimated blood loss was minimal.      [facility]

## 2023-11-08 NOTE — Transfer of Care (Signed)
 Immediate Anesthesia Transfer of Care Note  Patient: Christopher Huff  Procedure(s) Performed: COLONOSCOPY WITH PROPOFOL  ESOPHAGOGASTRODUODENOSCOPY (EGD) WITH PROPOFOL  POLYPECTOMY, INTESTINE  Patient Location: Endoscopy Unit  Anesthesia Type:General  Level of Consciousness: drowsy  Airway & Oxygen Therapy: Patient Spontanous Breathing  Post-op Assessment: Report given to RN and Post -op Vital signs reviewed and stable  Post vital signs: Reviewed and stable  Last Vitals:  Vitals Value Taken Time  BP 107/77 11/08/23 08:15  Temp    Pulse 63 11/08/23 08:15  Resp 19 11/08/23 08:15  SpO2 100 % 11/08/23 08:15  Vitals shown include unfiled device data.  Last Pain:  Vitals:   11/08/23 0815  TempSrc:   PainSc: Asleep         Complications: There were no known notable events for this encounter.

## 2023-11-08 NOTE — Op Note (Signed)
 [facility] Gastroenterology Patient Name: Christopher Huff Procedure Date: 11/08/2023 6:54 AM MRN: [id number] Account #: [id number] Date of Birth: 1955-07-24 Admit Type: Outpatient Age: 68 Room: [facility] ENDO ROOM 1 Gender: Male Note Status: Finalized Instrument Name: Endoscope [id number] Procedure:             Upper GI endoscopy Indications:           Cirrhosis, barretts esophagus Providers:             Elspeth Ozell Onita ROSALEA, DO Referring MD:          Alm Needle (Referring MD) Medicines:             Monitored Anesthesia Care Complications:         No immediate complications. Estimated blood loss:                         Minimal. Procedure:             Pre-Anesthesia Assessment:                        - Prior to the procedure, a History and Physical was                         performed, and patient medications and allergies were                         reviewed. The patient is competent. The risks and                         benefits of the procedure and the sedation options and                         risks were discussed with the patient. All questions                         were answered and informed consent was obtained.                         Patient identification and proposed procedure were                         verified by the physician, the nurse, the anesthetist                         and the technician in the endoscopy suite. Mental                         Status Examination: alert and oriented. Airway                         Examination: normal oropharyngeal airway and neck                         mobility. Respiratory Examination: clear to                         auscultation. CV Examination: RRR, no murmurs, no S3  or S4. Prophylactic Antibiotics: The patient does not                         require prophylactic antibiotics. Prior                         Anticoagulants: The patient has taken no anticoagulant                          or antiplatelet agents. ASA Grade Assessment: III - A                         patient with severe systemic disease. After reviewing                         the risks and benefits, the patient was deemed in                         satisfactory condition to undergo the procedure. The                         anesthesia plan was to use monitored anesthesia care                         (MAC). Immediately prior to administration of                         medications, the patient was re-assessed for adequacy                         to receive sedatives. The heart rate, respiratory                         rate, oxygen saturations, blood pressure, adequacy of                         pulmonary ventilation, and response to care were                         monitored throughout the procedure. The physical                         status of the patient was re-assessed after the                         procedure.                        After obtaining informed consent, the endoscope was                         passed under direct vision. Throughout the procedure,                         the patient's blood pressure, pulse, and oxygen                         saturations were monitored continuously. The Endoscope  was introduced through the mouth, and advanced to the                         second part of duodenum. The upper GI endoscopy was                         accomplished without difficulty. The patient tolerated                         the procedure well. Findings:      Diffuse moderate mucosal changes characterized by duodenopathy were       found in the entire duodenum. Estimated blood loss: none.      A single 5 to 6 mm sessile polyp with no bleeding was found in the       second portion of the duodenum. Biopsies were taken with a cold forceps       for histology. Estimated blood loss was minimal.      Two 5 to 7 mm sessile polyps with no bleeding and no stigmata of  recent       bleeding were found at the pylorus. Biopsies were taken with a cold       forceps for histology. Estimated blood loss was minimal.      Mild portal hypertensive gastropathy was found in the gastric body.       Estimated blood loss: none.      The esophagus and gastroesophageal junction were examined with white       light and narrow band imaging (NBI). There were esophageal mucosal       changes classified as Barrett's stage C0-M4 per Prague criteria. These       changes involved the mucosa extending to the Z-line (35 cm from the       incisors). The maximum longitudinal extent of these esophageal mucosal       changes was 4 cm in length. Biopsies not taken due to varices being       present. Estimated blood loss: none.      Grade I varices were found in the lower third of the esophagus. Two       columns. Estimated blood loss: none.      The exam of the esophagus was otherwise normal. Impression:            - Mucosal changes in the duodenum.                        - A single duodenal polyp. Biopsied.                        - Two gastric polyps. Biopsied.                        - Portal hypertensive gastropathy.                        - Esophageal mucosal changes classified as Barrett's                         stage C0-M4 per Prague criteria.                        - Grade I esophageal varices. Recommendation:        -  Patient has a contact number available for                         emergencies. The signs and symptoms of potential                         delayed complications were discussed with the patient.                         Return to normal activities tomorrow. Written                         discharge instructions were provided to the patient.                        - Discharge patient to home.                        - Resume previous diet.                        - Continue present medications.                        - Initiate beta blocker                         - Await pathology results.                        - Repeat upper endoscopy in 1 year for surveillance.                        - Return to GI office as previously scheduled.                        - The findings and recommendations were discussed with                         the patient. Procedure Code(s):     --- Professional ---                        [phone], Esophagogastroduodenoscopy, flexible,                         transoral; with biopsy, single or multiple Diagnosis Code(s):     --- Professional ---                        K31.89, Other diseases of stomach and duodenum                        K31.7, Polyp of stomach and duodenum                        K76.6, Portal hypertension                        K22.70, Barrett's esophagus without dysplasia                        I85.00, Esophageal varices  without bleeding CPT copyright 2022 American Medical Association. All rights reserved. The codes documented in this report are preliminary and upon coder review may  be revised to meet current compliance requirements. Attending Participation:      I personally performed the entire procedure. Elspeth Jungling, DO Elspeth Ozell Jungling DO, DO 11/08/2023 7:53:52 AM This report has been signed electronically. Number of Addenda: 0 Note Initiated On: 11/08/2023 6:54 AM Estimated Blood Loss:  Estimated blood loss was minimal.      [place]

## 2023-11-08 NOTE — Anesthesia Postprocedure Evaluation (Signed)
 Anesthesia Post Note  Patient: Christopher Huff  Procedure(s) Performed: COLONOSCOPY WITH PROPOFOL  ESOPHAGOGASTRODUODENOSCOPY (EGD) WITH PROPOFOL  POLYPECTOMY, INTESTINE  Patient location during evaluation: Endoscopy Anesthesia Type: General Level of consciousness: awake and alert Pain management: pain level controlled Vital Signs Assessment: post-procedure vital signs reviewed and stable Respiratory status: spontaneous breathing, nonlabored ventilation and respiratory function stable Cardiovascular status: blood pressure returned to baseline and stable Postop Assessment: no apparent nausea or vomiting Anesthetic complications: no   There were no known notable events for this encounter.   Last Vitals:  Vitals:   11/08/23 0825 11/08/23 0835  BP: 104/82 121/73  Pulse:    Resp: 17 18  Temp:    SpO2: 100% 100%    Last Pain:  Vitals:   11/08/23 0835  TempSrc:   PainSc: 0-No pain                 Fairy MARLA Coty Student

## 2023-11-08 NOTE — Interval H&P Note (Signed)
 History and Physical Interval Note: Preprocedure H&P from 11/08/23  was reviewed and there was no interval change after seeing and examining the patient.  Written consent was obtained from the patient after discussion of risks, benefits, and alternatives. Patient has consented to proceed with Esophagogastroduodenoscopy and Colonoscopy with possible intervention   11/08/2023 7:33 AM  Christopher Huff  has presented today for surgery, with the diagnosis of D12.6 (ICD-10-CM) - Adenomatous polyp of colon, unspecified part of colon K74.60 (ICD-10-CM) - Cirrhosis of liver without ascites, unspecified hepatic cirrhosis type (CMS/HHS-HCC) K22.70 (ICD-10-CM) - Barrett's esophagus without dysplasia.  The various methods of treatment have been discussed with the patient and family. After consideration of risks, benefits and other options for treatment, the patient has consented to  Procedure(s) with comments: COLONOSCOPY WITH PROPOFOL  (N/A) - IDDM ESOPHAGOGASTRODUODENOSCOPY (EGD) WITH PROPOFOL  (N/A) as a surgical intervention.  The patient's history has been reviewed, patient examined, no change in status, stable for surgery.  I have reviewed the patient's chart and labs.  Questions were answered to the patient's satisfaction.     Elspeth Ozell Jungling

## 2023-11-11 LAB — SURGICAL PATHOLOGY

## 2024-01-02 ENCOUNTER — Encounter: Payer: Self-pay | Admitting: Oncology

## 2024-01-08 ENCOUNTER — Other Ambulatory Visit

## 2024-01-09 ENCOUNTER — Ambulatory Visit
Admission: RE | Admit: 2024-01-09 | Discharge: 2024-01-09 | Disposition: A | Discharge status: Home / Self Care | Source: Ambulatory Visit | Attending: Gastroenterology | Admitting: Gastroenterology

## 2024-01-09 DIAGNOSIS — K7460 Unspecified cirrhosis of liver: Secondary | ICD-10-CM

## 2024-01-14 ENCOUNTER — Inpatient Hospital Stay: Attending: Oncology

## 2024-01-14 DIAGNOSIS — E538 Deficiency of other specified B group vitamins: Secondary | ICD-10-CM | POA: Diagnosis not present

## 2024-01-14 DIAGNOSIS — D500 Iron deficiency anemia secondary to blood loss (chronic): Secondary | ICD-10-CM

## 2024-01-14 DIAGNOSIS — D509 Iron deficiency anemia, unspecified: Secondary | ICD-10-CM | POA: Insufficient documentation

## 2024-01-14 DIAGNOSIS — Z806 Family history of leukemia: Secondary | ICD-10-CM | POA: Diagnosis not present

## 2024-01-14 DIAGNOSIS — F17290 Nicotine dependence, other tobacco product, uncomplicated: Secondary | ICD-10-CM | POA: Diagnosis not present

## 2024-01-14 DIAGNOSIS — K7030 Alcoholic cirrhosis of liver without ascites: Secondary | ICD-10-CM | POA: Insufficient documentation

## 2024-01-14 DIAGNOSIS — Z79899 Other long term (current) drug therapy: Secondary | ICD-10-CM | POA: Diagnosis not present

## 2024-01-14 LAB — CBC WITH DIFFERENTIAL (CANCER CENTER ONLY)
Abs Immature Granulocytes: 0.05 K/uL (ref 0.00–0.07)
Basophils Absolute: 0.1 K/uL (ref 0.0–0.1)
Basophils Relative: 2 %
Eosinophils Absolute: 0.2 K/uL (ref 0.0–0.5)
Eosinophils Relative: 4 %
HCT: 37.7 % — ABNORMAL LOW (ref 39.0–52.0)
Hemoglobin: 12.7 g/dL — ABNORMAL LOW (ref 13.0–17.0)
Immature Granulocytes: 1 %
Lymphocytes Relative: 22 %
Lymphs Abs: 0.8 K/uL (ref 0.7–4.0)
MCH: 32.7 pg (ref 26.0–34.0)
MCHC: 33.7 g/dL (ref 30.0–36.0)
MCV: 97.2 fL (ref 80.0–100.0)
Monocytes Absolute: 0.5 K/uL (ref 0.1–1.0)
Monocytes Relative: 15 %
Neutro Abs: 1.9 K/uL (ref 1.7–7.7)
Neutrophils Relative %: 56 %
Platelet Count: 151 K/uL (ref 150–400)
RBC: 3.88 MIL/uL — ABNORMAL LOW (ref 4.22–5.81)
RDW: 13.5 % (ref 11.5–15.5)
WBC Count: 3.5 K/uL — ABNORMAL LOW (ref 4.0–10.5)
nRBC: 0 % (ref 0.0–0.2)

## 2024-01-14 LAB — IRON AND TIBC
Iron: 72 ug/dL (ref 45–182)
Saturation Ratios: 19 % (ref 17.9–39.5)
TIBC: 378 ug/dL (ref 250–450)
UIBC: 307 ug/dL

## 2024-01-14 LAB — FERRITIN: Ferritin: 72 ng/mL (ref 24–336)

## 2024-01-15 ENCOUNTER — Inpatient Hospital Stay: Admitting: Oncology

## 2024-01-15 ENCOUNTER — Inpatient Hospital Stay

## 2024-01-15 ENCOUNTER — Encounter: Payer: Self-pay | Admitting: Oncology

## 2024-01-15 VITALS — BP 113/65 | HR 66 | Temp 96.1°F | Resp 18 | Wt 124.9 lb

## 2024-01-15 DIAGNOSIS — D509 Iron deficiency anemia, unspecified: Secondary | ICD-10-CM | POA: Diagnosis not present

## 2024-01-15 DIAGNOSIS — D500 Iron deficiency anemia secondary to blood loss (chronic): Secondary | ICD-10-CM

## 2024-01-15 DIAGNOSIS — K7030 Alcoholic cirrhosis of liver without ascites: Secondary | ICD-10-CM

## 2024-01-15 LAB — AFP TUMOR MARKER: AFP, Serum, Tumor Marker: 3 ng/mL (ref 0.0–8.4)

## 2024-01-15 NOTE — Progress Notes (Signed)
 " Hematology/Oncology Progress note [phone] [phone]         Patient Care Team: Epifanio Alm SQUIBB, MD as PCP - General (Infectious Diseases) Babara Call, MD as Consulting Physician (Oncology)  CHIEF COMPLAINTS/REASON FOR VISIT:  anemia  ASSESSMENT & PLAN:   Iron  deficiency anemia Labs are reviewed and discussed with patient.  Lab Results  Component Value Date   HGB 12.7 (L) 01/14/2024   TIBC 378 01/14/2024   IRONPCTSAT 19 01/14/2024   FERRITIN 72 01/14/2024   No need for Venofer .  Continue vitron c 1 tablet every day   Liver cirrhosis (HCC) Follow up with GI for [facility] surveillance, Endoscopy surveillance.     Orders Placed This Encounter  Procedures   CBC with Differential (Cancer Center Only)    Standing Status:   Future    Expected Date:   01/14/2025    Expiration Date:   04/14/2025   Iron  and TIBC    Standing Status:   Future    Expected Date:   01/14/2025    Expiration Date:   04/14/2025   CMP (Cancer Center only)    Standing Status:   Future    Expected Date:   01/14/2025    Expiration Date:   04/14/2025   Ferritin    Standing Status:   Future    Expected Date:   01/14/2025    Expiration Date:   04/14/2025   Follow up  12 months lab MD Venofer .   All questions were answered. The patient knows to call the clinic with any problems, questions or concerns.  Call Babara, MD, PhD [facility] Hematology Oncology 01/15/2024   HISTORY OF PRESENTING ILLNESS:   Christopher Huff is a  69 y.o.  male with PMH listed below was seen in consultation at the request of  Epifanio Alm SQUIBB, MD  for evaluation of weight loss, anemia, thrombocytopenia  Patient has a history of alcohol dependence, status post cessation in August 2022. 08/16/2020 CT abdomen pelvis with contrast showed which showed cirrhosis with mild is ascites.  Prominent vascularity along the distal aspect of the esophagus suspicious for varices.  duodenitis.   11/06/2020, MRI abdomen with and  without contrast showed cirrhotic hepatic morphology.  Portal hypertension.  Splenomegaly.  No arterially enhancing hepatic lesion.  No pancreatic lesion  He has noted 50 pound weight loss over the past 1 year.  Appetite is fair.  Denies any nausea vomiting diarrhea, coffee-ground emesis, hematemesis abdominal pain, dysphagia. He has CT chest without contrast scheduled for work-up of unintentional weight loss. 11/29/2020, CBC showed mild anemia with a hemoglobin of 13.1, MCV 99.2, platelet count 146,000.  Slight leukopenia with total WBC 3.8, normal differential.  Previous work-up results include Acute hepatitis panel negative.  Normal ferritin level.  Normal thyroid function He has vitamin B12 deficiency, vitamin D level was decreased at 240.  Patient has been on vitamin B-12 injections as well as oral B12 supplements.   Denies night sweats, fever.    INTERVAL HISTORY Christopher Huff is a 69 y.o. male who has above history reviewed by me today presents for reestablish care for IDA.  He previously tolerated iron  deficiency anemia.  He denies bleeding events. Appetite is good. Weight is stable.  Patient follows up with GI.  No new complaints.   Review of Systems  Constitutional:  Negative for appetite change, chills, fatigue, fever and unexpected weight change.  HENT:   Negative for hearing loss and voice change.   Eyes:  Negative for eye problems and icterus.  Respiratory:  Negative for chest tightness, cough and shortness of breath.   Cardiovascular:  Negative for chest pain and leg swelling.  Gastrointestinal:  Negative for abdominal distention, abdominal pain, constipation and diarrhea.  Endocrine: Negative for hot flashes.  Genitourinary:  Negative for difficulty urinating, dysuria and frequency.   Musculoskeletal:  Negative for arthralgias.  Skin:  Negative for itching and rash.  Neurological:  Negative for light-headedness and numbness.  Hematological:  Negative for adenopathy. Does  not bruise/bleed easily.  Psychiatric/Behavioral:  Negative for confusion.     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   [facility]   CLOSED MANIPULATION SHOULDER WITH STERIOD INJECTION Left 10/27/2021   Procedure: Left shoulder arthroscopic lysis of adhesions, capsular release, and manipulation under anesthesia with corticosteroid injection;  Surgeon: Tobie Priest, MD;  Location: [facility];  Service: Orthopedics;  Laterality: Left;  Diabetic   COLONOSCOPY     COLONOSCOPY WITH PROPOFOL  N/A 11/11/2020   Procedure: COLONOSCOPY WITH PROPOFOL ;  Surgeon: Onita Elspeth Sharper, DO;  Location: [facility] ENDOSCOPY;  Service: Gastroenterology;  Laterality: N/A;  DM   COLONOSCOPY WITH PROPOFOL  N/A 11/08/2023   Procedure: COLONOSCOPY WITH PROPOFOL ;  Surgeon: Onita Elspeth Sharper, DO;  Location: [facility] ENDOSCOPY;  Service: Gastroenterology;  Laterality: N/A;  IDDM   ESOPHAGOGASTRODUODENOSCOPY N/A 11/11/2020   Procedure: ESOPHAGOGASTRODUODENOSCOPY (EGD);  Surgeon: Onita Elspeth Sharper, DO;  Location: [facility] ENDOSCOPY;  Service: Gastroenterology;  Laterality: N/A;   ESOPHAGOGASTRODUODENOSCOPY (EGD) WITH PROPOFOL  N/A 11/08/2023   Procedure: ESOPHAGOGASTRODUODENOSCOPY (EGD) WITH PROPOFOL ;  Surgeon: Onita Elspeth Sharper, DO;  Location: [facility] ENDOSCOPY;  Service: Gastroenterology;  Laterality: N/A;   IMPLANTATION, AORTIC VALVE, TRANSCATHETER, SUBCLAVIAN ARTERY APPROACH     GROIN   POLYPECTOMY  11/08/2023   Procedure: POLYPECTOMY, INTESTINE;  Surgeon: Onita Elspeth Sharper, DO;  Location: [facility] ENDOSCOPY;  Service: Gastroenterology;;   RIGHT/LEFT HEART CATH AND CORONARY ANGIOGRAPHY N/A 12/08/2020   Procedure: RIGHT/LEFT HEART CATH AND CORONARY ANGIOGRAPHY;  Surgeon: Hester Wolm PARAS, MD;  Location: [facility] INVASIVE CV LAB;  Service: Cardiovascular;  Laterality: N/A;   TONSILLECTOMY      SOCIAL HISTORY: Social History   Socioeconomic History   Marital status: Married    Spouse name: Not on file   Number of children: Not on file   Years of education: Not on file   Highest education level: Not on file  Occupational History   Not on file  Tobacco Use   Smoking status: Never   Smokeless tobacco: Former    Types: Snuff   Tobacco comments:    Quit before surgery, surgery cancelled started back . Working on cessation   Vaping Use   Vaping status: Never Used  Substance and Sexual Activity   Alcohol use: Not Currently    Alcohol/week: 1.0 standard drink of alcohol    Types: 1 Cans of beer per week    Comment: stopped 08/2020. 10/24/21 - occasional   Drug use: Never   Sexual activity: Not on file  Other Topics  Concern   Not on file  Social History Narrative   Not on file   Social Drivers of Health   Tobacco Use: Medium Risk (01/15/2024)   Patient History    Smoking Tobacco Use: Never    Smokeless Tobacco Use: Former    Passive Exposure: Not on Actuary Strain: Low Risk  (05/02/2022)   Received from [facility]   Overall Financial Resource Strain (CARDIA)    Difficulty of Paying Living Expenses: Not hard at all  Food Insecurity: No Food Insecurity (05/02/2022)   Received from [facility]   Epic    Within the past 12 months, you worried that your food would run out before you got the money to buy more.: Never true    Within the past 12 months, the food you bought just didn't last and you didn't have money to get more.: Never true  Transportation Needs: No Transportation Needs (05/02/2022)    Received from [facility] - Transportation    In the past 12 months, has lack of transportation kept you from medical appointments or from getting medications?: No    Lack of Transportation (Non-Medical): No  Physical Activity: Not on file  Stress: Not on file  Social Connections: Not on file  Intimate Partner Violence: Not on file  Depression (PHQ2-9): Low Risk (07/10/2023)   Depression (PHQ2-9)    PHQ-2 Score: 0  Alcohol Screen: Not on file  Housing: Unknown (01/13/2024)   [facility]   Epic    In the last 12 months, was there a time when you were not able to pay the mortgage or rent on time?: No    Number of Times Moved in the Last Year: Not on file    At any time in the past 12 months, were you homeless or living in a shelter (including now)?: No  Utilities: Not At Risk (05/02/2022)   Received from [facility] Utilities    Threatened with loss of utilities: No  Health Literacy: Not on file    FAMILY HISTORY: Family History  Problem Relation Age of Onset   Heart attack Father    Leukemia Paternal Uncle     ALLERGIES:  has no known allergies.  MEDICATIONS:  Current Outpatient Medications  Medication Sig Dispense Refill   aspirin  EC 81 MG tablet Take 81 mg by mouth.     Cholecalciferol (VITAMIN D-1000 MAX ST) 25 MCG (1000 UT) tablet Take by mouth.     glimepiride (AMARYL) 1 MG tablet Take 4 mg by mouth daily.     insulin  degludec (TRESIBA FLEXTOUCH) 100 UNIT/ML FlexTouch Pen Inject 19 Units into the skin daily.     Iron -Vitamin C  65-125 MG TABS Take 1 tablet by mouth every other day.     metFORMIN  (GLUCOPHAGE ) 500 MG tablet Take 1 tablet (500 mg total) by mouth 2 (two) times daily with a meal. 60 tablet 11   omeprazole (PRILOSEC) 20 MG capsule Take 20 mg by mouth daily.     rosuvastatin (CRESTOR) 5 MG tablet Take by mouth.     vitamin B-12 (CYANOCOBALAMIN ) 1000 MCG tablet Take 1,000 mcg by  mouth daily.     No current facility-administered medications for this visit.     PHYSICAL EXAMINATION: ECOG PERFORMANCE STATUS: 1 - Symptomatic but completely ambulatory Vitals:   01/15/24 0950  BP: 113/65  Pulse: 66  Resp: 18  Temp: (!) 96.1 F (35.6 C)  SpO2: 100%   Filed Weights   01/15/24 0950  Weight: 124 lb 14.4 oz (56.7 kg)    Physical Exam HENT:     Head: Normocephalic and atraumatic.  Eyes:     General: No scleral icterus. Cardiovascular:     Rate and Rhythm: Normal rate.  Pulmonary:     Effort: Pulmonary effort is normal. No respiratory distress.  Musculoskeletal:        General: Normal range of motion.     Cervical back: Normal range of motion and neck supple.  Skin:    Findings: No erythema or rash.  Neurological:     Mental Status: He is alert and oriented to person, place, and time. Mental status is at baseline.  Psychiatric:        Mood and Affect: Mood normal.     LABORATORY DATA:  I have reviewed the data as listed    Latest Ref Rng & Units 01/14/2024    7:51 AM 07/09/2023    8:04 AM 01/08/2023    7:50 AM  CBC  WBC 4.0 - 10.5 K/uL 3.5  3.4  4.3   Hemoglobin 13.0 - 17.0 g/dL 87.2  87.6  86.2   Hematocrit 39.0 - 52.0 % 37.7  36.1  39.6   Platelets 150 - 400 K/uL 151  146  216       Latest Ref Rng & Units 09/11/2022    8:00 AM 08/16/2020    3:59 PM  CMP  Glucose 70 - 99 mg/dL  504   BUN 8 - 23 mg/dL  10   Creatinine 9.38 - 1.24 mg/dL  9.32   Sodium 864 - 854 mmol/L  131   Potassium 3.5 - 5.1 mmol/L  4.0   Chloride 98 - 111 mmol/L  94   CO2 22 - 32 mmol/L  24   Calcium 8.9 - 10.3 mg/dL  9.1   Total Protein 6.5 - 8.1 g/dL 7.3  7.4   Total Bilirubin 0.3 - 1.2 mg/dL 0.7  1.5   Alkaline Phos 38 - 126 U/L 131  166   AST 15 - 41 U/L 29  33   ALT 0 - 44 U/L 21  21      Iron /TIBC/Ferritin/ %Sat    Component Value Date/Time   IRON  72 01/14/2024 0751   TIBC 378 01/14/2024 0751   FERRITIN 72 01/14/2024 0751   IRONPCTSAT 19 01/14/2024 0751       RADIOGRAPHIC STUDIES: I have personally reviewed the radiological images as listed and agreed with the findings in the report. US  Abdomen Limited RUQ (LIVER/GB) Result Date: 01/09/2024 EXAM: Right Upper Quadrant Abdominal Ultrasound 01/09/2024 09:53:19 AM TECHNIQUE: Real-time ultrasonography of the right upper quadrant of the abdomen was performed. COMPARISON: US  Abdomen Limited 01/22/2023. US  Abdomen Limited 01/08/2022. CT abdomen and pelvis 07/09/2023. CLINICAL HISTORY: 69 year old male with cirrhosis of liver without ascites, unspecified hepatic cirrhosis type, and hepatocellular carcinoma surveillance. FINDINGS: LIVER: Diffusely coarse hepatic echotexture and nodular liver contour consistent with cirrhosis previously demonstrated. No discrete liver lesion by ultrasound. No intrahepatic biliary ductal dilatation. Hepatopetal flow in the portal vein. BILIARY SYSTEM: Gallbladder wall thickness measures 1.9 mm. No pericholecystic fluid. No cholelithiasis. Common bile duct measures 1.8 mm. No sonographic Beverley sign is elicited. OTHER: Right kidney is partially visible, negative. No right upper quadrant ascites. IMPRESSION: 1. Cirrhosis with no discrete hepatic lesion by ultrasound. 2. No acute right upper quadrant ultrasound finding. Electronically signed by: Helayne  Shona MD MD 01/09/2024 10:02 AM EST RP Workstation: [id number]      "

## 2024-01-15 NOTE — Assessment & Plan Note (Signed)
Follow up with GI for [facility] surveillance, Endoscopy surveillance.

## 2024-01-15 NOTE — Assessment & Plan Note (Signed)
 Labs are reviewed and discussed with patient.  Lab Results  Component Value Date   HGB 12.7 (L) 01/14/2024   TIBC 378 01/14/2024   IRONPCTSAT 19 01/14/2024   FERRITIN 72 01/14/2024   No need for Venofer .  Continue vitron c 1 tablet every day

## 2024-01-22 ENCOUNTER — Other Ambulatory Visit: Payer: Self-pay | Admitting: Gastroenterology

## 2024-01-22 DIAGNOSIS — K7460 Unspecified cirrhosis of liver: Secondary | ICD-10-CM

## 2024-07-21 ENCOUNTER — Other Ambulatory Visit

## 2025-01-13 ENCOUNTER — Inpatient Hospital Stay

## 2025-01-14 ENCOUNTER — Inpatient Hospital Stay: Admitting: Oncology

## 2025-01-14 ENCOUNTER — Inpatient Hospital Stay
